# Patient Record
Sex: Male | Born: 1955 | Hispanic: Yes | Smoking: Former smoker
Health system: Southern US, Community
[De-identification: ages and names within clinical notes are randomized; demographics above are authoritative.]

## PROBLEM LIST (undated history)

## (undated) ENCOUNTER — Ambulatory Visit: Admission: EM | Source: Home / Self Care

## (undated) HISTORY — PX: HERNIA REPAIR: SHX51

---

## 2001-06-19 ENCOUNTER — Inpatient Hospital Stay (HOSPITAL_COMMUNITY): Admission: AD | Admit: 2001-06-19 | Discharge: 2001-06-23 | Payer: Self-pay | Admitting: Cardiology

## 2001-09-13 ENCOUNTER — Emergency Department (HOSPITAL_COMMUNITY): Admission: EM | Admit: 2001-09-13 | Discharge: 2001-09-13 | Payer: Self-pay | Admitting: Internal Medicine

## 2006-04-23 ENCOUNTER — Ambulatory Visit: Payer: Self-pay | Admitting: Cardiology

## 2010-10-06 NOTE — Cardiovascular Report (Signed)
[place]  Patient:    Michael Underwood, Michael Underwood Visit Number: [id number] MRN: [id number]          Service Type: MED Location: [phone] Attending Physician:  Mirian Mo Dictated by:   Daisey Must, M.D. [facility] Proc. Date: 06/23/01 Admit Date:  06/19/2001   CC:         Doreen Beam, M.D.             [facility]             Cardiac Catheterization Laboratory                        Cardiac Catheterization  PROCEDURES PERFORMED: Left heart catheterization with coronary angiography and left ventriculography.  INDICATIONS: The patient is a 55 year old male, who presented with recurrent episodes of substernal chest pain. A stress Cardiolite scan performed in [place] was interpreted as being abnormal, although I do not have complete details of this study. Because of his chest pain and abnormal Cardiolite scan, he was referred for cardiac catheterization.  DESCRIPTION OF PROCEDURE: A 6 French sheath was placed in the right femoral artery. Standard Judkins 6 French catheters were utilized.  Contrast was Omnipaque. There were no complications.  RESULTS:  HEMODYNAMICS: Left ventricular pressure 128/18.  Aortic pressure 128/74. There was no aortic valve gradient.  LEFT VENTRICULOGRAM: There is mild global hypokinesis of the left ventricle. Ejection fraction is calculated at 50%. There is no mitral regurgitation.  CORONARY ARTERIOGRAPHY: (Right dominant).  Left main has an ostial 30% stenosis.  Left anterior descending artery has an ostial 40% stenosis followed by a 30% stenosis in the proximal vessel. The LAD gives rise to a single small diagonal branch.  The left circumflex has a 20% stenosis in the proximal vessel. The circumflex gives rise to a large ramus intermedius, small OM-1 and normal sized OM-2.  The right coronary artery has a 20% stenosis in the proximal vessel. There was some catheter-induced spasm in the proximal right  coronary artery which was relieved with intracoronary nitroglycerin. The distal right coronary artery gives rise to a normal sized bifurcating posterior descending artery, a large first posterolateral branch, and a small second posterolateral branch.  IMPRESSIONS: 1. Mildly decreased left ventricular systolic function. 2. Mild but nonobstructive coronary artery disease.  SUMMARY: In summary, the patient appears to have a mild nonischemic cardiomyopathy. His chest pain is most likely noncardiac in etiology.  PLAN: The patient will be managed medically. Dictated by:   Daisey Must, M.D. [facility] Attending Physician:  Mirian Mo DD:  06/23/01 TD:  06/23/01 Job: [id number]

## 2010-10-06 NOTE — Discharge Summary (Signed)
[place]  Patient:    Michael Underwood, Michael Underwood Visit Number: [id number] MRN: [id number]          Service Type: MED Location: [phone] Attending Physician:  Mirian Mo Dictated by:   Rozell Searing, P.A.-C. Admit Date:  06/19/2001 Discharge Date: 06/23/2001   CC:         Doreen Beam, M.D., [place], [place]   Referring Physician Discharge [place]  PROCEDURE:  Coronary angiogram, June 23, 2001.  REASON FOR ADMISSION:  [name] is a 55 year old male, native of [place], who does not speak English, and who initially presented to [facility] for evaluation of chest pain radiating down the left arm.  His cardiac risk factors are notable for tobacco use and family history of coronary artery disease.  He was admitted and ruled out for myocardial infarction and was seen in consultation by Dr. Andee Lineman.  He was initially referred for an exercise stress Cardiolite test and, following a result suggestive of inferior/inferoseptal ischemia with ER of 44%, arrangements were made to transfer patient to [facility] for diagnostic coronary angiography.  LABORATORY DATA:  [facility]):  Negative MB fraction x 3 with peak total CPK of 219; marginally elevated troponin I levels (peak 0.06).  Lipid profile:  Total cholesterol 166, triglycerides 93, HDL 33, LDL 114, cholesterol/HDL ratio of 5.0, INR 1.0.  CBC normal.  Sodium 139, potassium 3.5, glucose 128, BUN 9, creatinine 0.8.  HOSPITAL COURSE:  Following transfer from [facility], patient was kept on aspirin and low-dose metoprolol.  Due to an overloaded cath schedule, patient had to wait proceeding with coronary angiography until Monday.  In the interim, he reported no complaints of chest discomfort.  Coronary angiogram, performed June 23, 2001, by Dr. Gerri Spore (see cath report for full details), revealed mild, nonobstructive coronary artery disease with mild LV dysfunction (EF 50%) with mild global  hypokinesis. Specifically, there was 30% ostial LMCA ______ and 40% ostial, 30% proximal LAD; 40% proximal CFX; 20% proximal RCA.  Medical therapy was recommended and patient was cleared for discharge later the same day.  DISCHARGE MEDICATIONS: 1. Coated aspirin 81 mg q.d. 2. Toprol XL 25 mg q.d.  INSTRUCTIONS:  The patient is to refrain from any heavy lifting, driving, or strenuous activity x 2 days; maintain low fat/cholesterol diet; call the office if there is any swelling/bleeding of the groin.  FOLLOW-UP:  The patient is scheduled to follow up with Dr. Simona Huh at [facility], July 11, 2001, at 11:45 a.m.  DISCHARGE DIAGNOSES: 1. Nonischemic cardiomyopathy.    a. Negative serial cardiac enzymes.    b. Mild, nonobstructive coronary artery disease/ejection fraction 50% -       cardiac catheterization June 23, 2001. 2. Dyslipidemia. 3. Tobacco. 4. Glucose intolerance. 5. Family history of coronary artery disease. Dictated by:   Rozell Searing, P.A.-C. Attending Physician:  Mirian Mo DD:  06/23/01 TD:  06/23/01 Job: [id number]

## 2018-03-30 ENCOUNTER — Encounter (HOSPITAL_COMMUNITY): Payer: Self-pay | Admitting: Emergency Medicine

## 2018-03-30 ENCOUNTER — Emergency Department (HOSPITAL_COMMUNITY)
Admission: EM | Admit: 2018-03-30 | Discharge: 2018-03-31 | Disposition: A | Discharge status: Home / Self Care | Payer: Self-pay | Attending: Emergency Medicine | Admitting: Emergency Medicine

## 2018-03-30 ENCOUNTER — Other Ambulatory Visit: Payer: Self-pay

## 2018-03-30 ENCOUNTER — Emergency Department (HOSPITAL_COMMUNITY): Payer: Self-pay

## 2018-03-30 DIAGNOSIS — R3911 Hesitancy of micturition: Secondary | ICD-10-CM | POA: Insufficient documentation

## 2018-03-30 DIAGNOSIS — R112 Nausea with vomiting, unspecified: Secondary | ICD-10-CM | POA: Insufficient documentation

## 2018-03-30 DIAGNOSIS — N201 Calculus of ureter: Secondary | ICD-10-CM | POA: Insufficient documentation

## 2018-03-30 LAB — COMPREHENSIVE METABOLIC PANEL
ALK PHOS: 77 U/L (ref 38–126)
ALT: 15 U/L (ref 0–44)
AST: 15 U/L (ref 15–41)
Albumin: 4 g/dL (ref 3.5–5.0)
Anion gap: 6 (ref 5–15)
BILIRUBIN TOTAL: 0.7 mg/dL (ref 0.3–1.2)
BUN: 22 mg/dL (ref 8–23)
CALCIUM: 10.3 mg/dL (ref 8.9–10.3)
CO2: 24 mmol/L (ref 22–32)
CREATININE: 1.04 mg/dL (ref 0.61–1.24)
Chloride: 107 mmol/L (ref 98–111)
Glucose, Bld: 153 mg/dL — ABNORMAL HIGH (ref 70–99)
Potassium: 3.8 mmol/L (ref 3.5–5.1)
Sodium: 137 mmol/L (ref 135–145)
TOTAL PROTEIN: 7.6 g/dL (ref 6.5–8.1)

## 2018-03-30 LAB — CBC
HCT: 42.5 % (ref 39.0–52.0)
Hemoglobin: 13.5 g/dL (ref 13.0–17.0)
MCH: 27.7 pg (ref 26.0–34.0)
MCHC: 31.8 g/dL (ref 30.0–36.0)
MCV: 87.1 fL (ref 80.0–100.0)
NRBC: 0 % (ref 0.0–0.2)
PLATELETS: 321 10*3/uL (ref 150–400)
RBC: 4.88 MIL/uL (ref 4.22–5.81)
RDW: 13.6 % (ref 11.5–15.5)
WBC: 12.3 10*3/uL — AB (ref 4.0–10.5)

## 2018-03-30 LAB — URINALYSIS, ROUTINE W REFLEX MICROSCOPIC
BILIRUBIN URINE: NEGATIVE
Bacteria, UA: NONE SEEN
Glucose, UA: 50 mg/dL — AB
Ketones, ur: 20 mg/dL — AB
LEUKOCYTES UA: NEGATIVE
NITRITE: NEGATIVE
Protein, ur: 100 mg/dL — AB
RBC / HPF: >50 RBC/hpf — ABNORMAL HIGH (ref 0–5)
SPECIFIC GRAVITY, URINE: 1.02 (ref 1.005–1.030)
pH: 6 (ref 5.0–8.0)

## 2018-03-30 LAB — LIPASE, BLOOD: LIPASE: 22 U/L (ref 11–51)

## 2018-03-30 MED ORDER — IOPAMIDOL (ISOVUE-300) INJECTION 61%
100.0000 mL | Freq: Once | INTRAVENOUS | Status: AC | PRN
  Administered 2018-03-31: 100 mL via INTRAVENOUS

## 2018-03-30 MED ORDER — ONDANSETRON 4 MG PO TBDP
ORAL_TABLET | ORAL | Status: AC
  Filled 2018-03-30: qty 1

## 2018-03-30 MED ORDER — ONDANSETRON 4 MG PO TBDP
4.0000 mg | ORAL_TABLET | Freq: Once | ORAL | Status: AC
  Administered 2018-03-30: 4 mg via ORAL

## 2018-03-30 NOTE — ED Notes (Signed)
Pt denies any vomiting since zofran was given.

## 2018-03-30 NOTE — ED Triage Notes (Addendum)
Pt C/O RLQ abdominal pain that started around 2 hours ago. Pt states it makes him "vomit every once in a while." Denies diarrhea. Pt states he has not been able to urinate since this AM.

## 2018-03-31 MED ORDER — ONDANSETRON 4 MG PO TBDP: 4.0000 mg | ORAL_TABLET | Freq: Three times a day (TID) | ORAL | 0 refills | Status: DC | PRN

## 2018-03-31 MED ORDER — HYDROCODONE-ACETAMINOPHEN 5-325 MG PO TABS: 1.0000 | ORAL_TABLET | ORAL | 0 refills | Status: DC | PRN

## 2018-03-31 MED ORDER — HYDROCODONE-ACETAMINOPHEN 5-325 MG PO TABS: 2.0000 | ORAL_TABLET | ORAL | 0 refills | Status: DC | PRN

## 2018-03-31 MED ORDER — TAMSULOSIN HCL 0.4 MG PO CAPS
0.4000 mg | ORAL_CAPSULE | Freq: Once | ORAL | Status: AC
  Administered 2018-03-31: 0.4 mg via ORAL
  Filled 2018-03-31: qty 1

## 2018-03-31 MED ORDER — TAMSULOSIN HCL 0.4 MG PO CAPS: 0.4000 mg | ORAL_CAPSULE | Freq: Every day | ORAL | 0 refills | Status: DC

## 2018-03-31 NOTE — Discharge Instructions (Addendum)
You have a large kidney stone in your lower right ureter (just above your bladder) which you may or may not be able to pass without surgical intervention.  You will need to see a urologist as soon as possible - call Dr Ronne Binning for an office visit this week.  In the interim, use the pain medicine prescribed if needed for pain relief - this will make you drowsy - do not drive within 4 hours of taking this medicine. Strain your urine so you will know if it passes and so you can take it to your appointment with Dr. Ronne Binning.  Take your next dose of flomax tomorrow night - this medicine can help to relax the ureter and help the stone pass easier.  Return here for any fevers, worse pain, uncontrolled vomiting or any new symptoms.   Also, we discussed tonight that your CT scan shows that your prostate gland is enlarged.  This can be a normal change that happens with aging, but you need to discuss this finding with Dr. Ronne Binning so is aware of this - he may decide you need further tests to make sure your prostate is healthy.    [facility]  [address] [place], [place] [address] [phone]  Services [facility] offers a variety of basic health services.  Services include but are not limited to: Blood pressure checks  Heart rate checks  Blood sugar checks  Urine analysis  Rapid strep tests  Pregnancy tests.  Health education and referrals  People needing more complex services will be directed to a physician online. Using these virtual visits, doctors can evaluate and prescribe medicine and treatments. There will be no medication on-site, though [facility] will help patients fill their prescriptions at little to no cost.   For More information please go to: [email or web address]

## 2018-03-31 NOTE — ED Provider Notes (Signed)
[facility] EMERGENCY DEPARTMENT Provider Note   CSN: [id number] Arrival date & time: 03/30/18  1324     History   Chief Complaint Chief Complaint  Patient presents with  . Abdominal Pain    HPI Michael Underwood is a 62 y.o. male with a distant history of kidney stones (last approx 10 years ago) presenting with RLQ pain described as intermittent aching pain with nausea and emesis x 2.  He also reports decreased urinary frequency with darker than normal urine.  His pain start shortly after eating pizza for dinner. He denies fevers or chills and his nausea is currently relieved after receiving zofran here.  He has had no treatment prior to arrival. Denies dysuria flank pain.  The history is provided by the patient and a relative.    History reviewed. No pertinent past medical history.  There are no active problems to display for this patient.   History reviewed. No pertinent surgical history.      Home Medications    Prior to Admission medications   Medication Sig Start Date End Date Taking? Authorizing Provider  HYDROcodone-acetaminophen (NORCO/VICODIN) 5-325 MG tablet Take 1 tablet by mouth every 4 (four) hours as needed. 03/31/18   Burgess Amor, PA-C  HYDROcodone-acetaminophen (NORCO/VICODIN) 5-325 MG tablet Take 2 tablets by mouth every 4 (four) hours as needed. 03/31/18   Burgess Amor, PA-C  ondansetron (ZOFRAN ODT) 4 MG disintegrating tablet Take 1 tablet (4 mg total) by mouth every 8 (eight) hours as needed for nausea or vomiting. 03/31/18   Burgess Amor, PA-C  tamsulosin (FLOMAX) 0.4 MG CAPS capsule Take 1 capsule (0.4 mg total) by mouth daily after supper. 03/31/18   Burgess Amor, PA-C    Family History No family history on file.  Social History Social History   Tobacco Use  . Smoking status: Never Smoker  . Smokeless tobacco: Never Used  Substance Use Topics  . Alcohol use: Never    Frequency: Never  . Drug use: Never     Allergies   Patient has no known  allergies.   Review of Systems Review of Systems  Constitutional: Negative for chills and fever.  HENT: Negative for congestion.   Eyes: Negative.   Respiratory: Negative for chest tightness and shortness of breath.   Cardiovascular: Negative for chest pain.  Gastrointestinal: Positive for abdominal pain, nausea and vomiting.  Genitourinary: Positive for decreased urine volume and difficulty urinating. Negative for dysuria.  Musculoskeletal: Negative for arthralgias, joint swelling and neck pain.  Skin: Negative.  Negative for rash and wound.  Neurological: Negative for dizziness, weakness, light-headedness, numbness and headaches.  Psychiatric/Behavioral: Negative.      Physical Exam Updated Vital Signs BP 117/78   Pulse 73   Temp 97.8 F (36.6 C) (Oral)   Resp 17   SpO2 97%   Physical Exam  Constitutional: He appears well-developed and well-nourished.  HENT:  Head: Normocephalic and atraumatic.  Eyes: Conjunctivae are normal.  Neck: Normal range of motion.  Cardiovascular: Normal rate, regular rhythm, normal heart sounds and intact distal pulses.  Pulmonary/Chest: Effort normal and breath sounds normal. He has no wheezes.  Abdominal: Soft. Bowel sounds are normal. There is tenderness in the right lower quadrant. There is no rigidity, no rebound, no guarding, no CVA tenderness and no tenderness at McBurney's point.  Musculoskeletal: Normal range of motion.  Neurological: He is alert.  Skin: Skin is warm and dry.  Psychiatric: He has a normal mood and affect.  Nursing note and vitals  reviewed.    ED Treatments / Results  Labs (all labs ordered are listed, but only abnormal results are displayed) Labs Reviewed  COMPREHENSIVE METABOLIC PANEL - Abnormal; Notable for the following components:      Result Value   Glucose, Bld 153 (*)    All other components within normal limits  CBC - Abnormal; Notable for the following components:   WBC 12.3 (*)    All other  components within normal limits  URINALYSIS, ROUTINE W REFLEX MICROSCOPIC - Abnormal; Notable for the following components:   APPearance CLOUDY (*)    Glucose, UA 50 (*)    Hgb urine dipstick LARGE (*)    Ketones, ur 20 (*)    Protein, ur 100 (*)    RBC / HPF >50 (*)    All other components within normal limits  LIPASE, BLOOD    EKG None  Radiology Ct Abdomen Pelvis W Contrast  Result Date: 03/31/2018 CLINICAL DATA:  Acute onset of right lower quadrant abdominal pain and vomiting. Inability to urinate. EXAM: CT ABDOMEN AND PELVIS WITH CONTRAST TECHNIQUE: Multidetector CT imaging of the abdomen and pelvis was performed using the standard protocol following bolus administration of intravenous contrast. CONTRAST:  ISOVUE-300 IOPAMIDOL (ISOVUE-300) INJECTION 61% COMPARISON:  CT of the abdomen and pelvis, and abdominal ultrasound, performed 01/26/2008 FINDINGS: Lower chest: Minimal bibasilar atelectasis is noted. The visualized portions of the mediastinum are unremarkable. Hepatobiliary: The liver is unremarkable in appearance. The gallbladder is unremarkable in appearance. The common bile duct remains normal in caliber. 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 The pancreatic head is unremarkable in appearance. Spleen: The spleen is unremarkable in appearance. Adrenals/Urinary Tract: The adrenal glands are unremarkable in appearance. Small bilateral renal cysts are seen. Right-sided perinephric stranding and fluid are noted. There is mild prominence of the right ureter, reflecting an obstructing 6 x 5 mm stone at the distal right ureter, 4 cm proximal to the right vesicoureteral junction. No significant hydronephrosis is seen. The left kidney is otherwise grossly unremarkable. Stomach/Bowel: The stomach is unremarkable in appearance. The small bowel is within normal limits. The appendix is normal in caliber, without evidence of appendicitis. The colon is  unremarkable in appearance. Vascular/Lymphatic: The abdominal aorta is unremarkable in appearance. The inferior vena cava is grossly unremarkable. No retroperitoneal lymphadenopathy is seen. No pelvic sidewall lymphadenopathy is identified. Reproductive: The bladder is mildly distended and grossly unremarkable. The prostate is normal in size. Increased central density at the prostate is nonspecific. Other: No additional soft tissue abnormalities are seen. Musculoskeletal: No acute osseous abnormalities are identified. The visualized musculature is unremarkable in appearance. IMPRESSION: 1. Mild prominence of the right ureter, reflecting an obstructing 6 x 5 mm stone at the distal right ureter, 4 cm proximal to the right vesicoureteral junction. No significant hydronephrosis seen at this time. 2. Small bilateral renal cysts seen. 3. Nonspecific increased central density at the prostate. Would correlate with PSA. Electronically Signed   By: Roanna Raider M.D.   On: 03/31/2018 00:58    Procedures Procedures (including critical care time)  Medications Ordered in ED Medications  ondansetron (ZOFRAN-ODT) disintegrating tablet 4 mg (4 mg Oral Given 03/30/18 2004)  iopamidol (ISOVUE-300) 61 % injection 100 mL (100 mLs Intravenous Contrast Given 03/31/18 0038)  tamsulosin (FLOMAX) capsule 0.4 mg (0.4 mg Oral Given 03/31/18 0204)     Initial Impression / Assessment and Plan / ED Course  I have reviewed the triage vital  signs and the nursing notes.  Pertinent labs & imaging results that were available during my care of the patient were reviewed by me and considered in my medical decision making (see chart for details).    Labs and Ct imaging reviewed and discussed with pt including ureteral stone and prostate findings. He was placed on hydrocodone and flomax, referral to urology for further management. Urine strainer given. Strict return precautions discussed. Pt sx free and comfortable at time of  dc.  Final Clinical Impressions(s) / ED Diagnoses   Final diagnoses:  Right ureteral stone    ED Discharge Orders         Ordered    HYDROcodone-acetaminophen (NORCO/VICODIN) 5-325 MG tablet  Every 4 hours PRN     03/31/18 0114    tamsulosin (FLOMAX) 0.4 MG CAPS capsule  Daily after supper     03/31/18 0114    ondansetron (ZOFRAN ODT) 4 MG disintegrating tablet  Every 8 hours PRN     03/31/18 0121    HYDROcodone-acetaminophen (NORCO/VICODIN) 5-325 MG tablet  Every 4 hours PRN     03/31/18 0121           Burgess Amor, PA-C 03/31/18 1415    Vanetta Mulders, MD 03/31/18 1620

## 2018-04-01 MED FILL — Hydrocodone-Acetaminophen Tab 5-325 MG: ORAL | Qty: 6 | Status: AC

## 2019-12-09 ENCOUNTER — Ambulatory Visit (INDEPENDENT_AMBULATORY_CARE_PROVIDER_SITE_OTHER): Payer: Self-pay | Admitting: Internal Medicine

## 2020-05-11 ENCOUNTER — Ambulatory Visit (INDEPENDENT_AMBULATORY_CARE_PROVIDER_SITE_OTHER): Payer: Medicare Other | Admitting: Internal Medicine

## 2021-06-14 ENCOUNTER — Ambulatory Visit
Admission: EM | Admit: 2021-06-14 | Discharge: 2021-06-14 | Disposition: A | Discharge status: Home / Self Care | Payer: Medicare HMO | Attending: Family Medicine | Admitting: Family Medicine

## 2021-06-14 ENCOUNTER — Other Ambulatory Visit: Payer: Self-pay

## 2021-06-14 DIAGNOSIS — K4090 Unilateral inguinal hernia, without obstruction or gangrene, not specified as recurrent: Secondary | ICD-10-CM | POA: Diagnosis not present

## 2021-06-14 MED ORDER — NAPROXEN 500 MG PO TABS: 500.0000 mg | ORAL_TABLET | Freq: Two times a day (BID) | ORAL | 0 refills | Status: DC

## 2021-06-14 NOTE — ED Triage Notes (Signed)
Pt reports he started having left sided groin pain 2 months ago. Pt reports he was diagnosed with a inguinal hernia 3 weeks ago. Inguinal belt supports gives relief. Pt denies pain at this moment. Pt wants to know hat to do next.

## 2021-06-14 NOTE — Discharge Instructions (Addendum)
Keep your appointment with your new primary doctor to discuss surgical referral.

## 2021-06-14 NOTE — ED Provider Notes (Signed)
°  [facility] CARE CENTER   [id number] 06/14/21 Arrival Time: 1056  ASSESSMENT & PLAN:  1. Non-recurrent unilateral inguinal hernia without obstruction or gangrene    No pain or signs of strangulation. Benign abdominal exam. No indications for urgent abdominal/pelvic imaging at this time. Discussed. Continue wearing hernia belt.  Meds ordered this encounter  Medications   naproxen (NAPROSYN) 500 MG tablet    Sig: Take 1 tablet (500 mg total) by mouth 2 (two) times daily with a meal.    Dispense:  20 tablet    Refill:  0     Discharge Instructions      Keep your appointment with your new primary doctor to discuss surgical referral.    Follow-up Information     [facility] EMERGENCY DEPARTMENT.   Specialty: Emergency Medicine Why: If symptoms worsen in any way. Contact information: [address] [id number] mc [place] [address] [phone]                Reviewed expectations re: course of current medical issues. Questions answered. Outlined signs and symptoms indicating need for more acute intervention. Patient verbalized understanding. After Visit Summary given.   SUBJECTIVE: History from: patient and family. Michael Underwood is a 66 y.o. male who reports being diagnosed in the past with a L inguinal hernia. Occas discomfort. Wears hernia belt which helps. No current pain or testicle swelling. "Just want to know what to do next". Normal bowel/bladder habits.  History reviewed. No pertinent surgical history.   OBJECTIVE:  Vitals:   06/14/21 1137  BP: 137/83  Pulse: 68  Resp: 18  Temp: 97.8 F (36.6 C)  TempSrc: Oral  SpO2: 98%    General appearance: alert, oriented, no acute distress Lungs: unlabored respirations Abdomen: soft; without distention; no specific tenderness to palpation GU: appears to have a direct L inguinal hernia that is easily reducible; no scrotal swelling Back: without reported CVA  tenderness; FROM at waist Extremities: without LE edema; symmetrical; without gross deformities Skin: warm and dry Neurologic: normal gait Psychological: alert and cooperative; normal mood and affect  No Known Allergies                                             History reviewed. No pertinent past medical history.  Social History   Socioeconomic History   Marital status: Divorced    Spouse name: Not on file   Number of children: Not on file   Years of education: Not on file   Highest education level: Not on file  Occupational History   Not on file  Tobacco Use   Smoking status: Never   Smokeless tobacco: Never  Substance and Sexual Activity   Alcohol use: Never   Drug use: Never   Sexual activity: Not on file  Other Topics Concern   Not on file  Social History Narrative   Not on file   Social Determinants of Health   Financial Resource Strain: Not on file  Food Insecurity: Not on file  Transportation Needs: Not on file  Physical Activity: Not on file  Stress: Not on file  Social Connections: Not on file  Intimate Partner Violence: Not on file    History reviewed. No pertinent family history.   Mardella Layman, MD 06/14/21 1212

## 2021-07-03 ENCOUNTER — Ambulatory Visit
Admission: EM | Admit: 2021-07-03 | Discharge: 2021-07-03 | Disposition: A | Discharge status: Home / Self Care | Payer: Medicare HMO | Attending: Urgent Care | Admitting: Urgent Care

## 2021-07-03 ENCOUNTER — Other Ambulatory Visit: Payer: Self-pay

## 2021-07-03 DIAGNOSIS — N23 Unspecified renal colic: Secondary | ICD-10-CM | POA: Insufficient documentation

## 2021-07-03 DIAGNOSIS — R300 Dysuria: Secondary | ICD-10-CM | POA: Insufficient documentation

## 2021-07-03 DIAGNOSIS — Z87442 Personal history of urinary calculi: Secondary | ICD-10-CM | POA: Insufficient documentation

## 2021-07-03 LAB — POCT URINALYSIS DIP (MANUAL ENTRY)
Bilirubin, UA: NEGATIVE
Glucose, UA: NEGATIVE mg/dL
Ketones, POC UA: NEGATIVE mg/dL
Leukocytes, UA: NEGATIVE
Nitrite, UA: NEGATIVE
Protein Ur, POC: NEGATIVE mg/dL
Spec Grav, UA: 1.02 (ref 1.010–1.025)
Urobilinogen, UA: 0.2 E.U./dL
pH, UA: 7 (ref 5.0–8.0)

## 2021-07-03 MED ORDER — TAMSULOSIN HCL 0.4 MG PO CAPS: 0.4000 mg | ORAL_CAPSULE | Freq: Every day | ORAL | 0 refills | Status: DC

## 2021-07-03 MED ORDER — ACETAMINOPHEN 325 MG PO TABS: 650.0000 mg | ORAL_TABLET | Freq: Four times a day (QID) | ORAL | 0 refills | Status: DC | PRN

## 2021-07-03 NOTE — ED Triage Notes (Signed)
Pt presents with  c/o dysuria and incontinence that began last night

## 2021-07-03 NOTE — ED Provider Notes (Signed)
[facility]   MRN: [id number] DOB: [birth year]  Subjective:   Michael Underwood is a 66 y.o. male presenting for 1 day history of recurrent painful urination, urinary straining.  He is also had some right-sided flank pain.  Denies fever, nausea, vomiting, hematuria, pelvic or perianal pain.  No history of BPH.  No history of prostatitis.  No concern for an STI.  He does have a history of a ureteral stone.  Does not know if he ever passed it.  Does not see a urologist.  No current facility-administered medications for this encounter.  Current Outpatient Medications:    naproxen (NAPROSYN) 500 MG tablet, Take 1 tablet (500 mg total) by mouth 2 (two) times daily with a meal., Disp: 20 tablet, Rfl: 0   ondansetron (ZOFRAN ODT) 4 MG disintegrating tablet, Take 1 tablet (4 mg total) by mouth every 8 (eight) hours as needed for nausea or vomiting., Disp: 10 tablet, Rfl: 0   tamsulosin (FLOMAX) 0.4 MG CAPS capsule, Take 1 capsule (0.4 mg total) by mouth daily after supper., Disp: 10 capsule, Rfl: 0   No Known Allergies  History reviewed. No pertinent past medical history.   History reviewed. No pertinent surgical history.  History reviewed. No pertinent family history.  Social History   Tobacco Use   Smoking status: Never   Smokeless tobacco: Never  Substance Use Topics   Alcohol use: Never   Drug use: Never    ROS   Objective:   Vitals: BP 130/79    Pulse 68    Temp 97.7 F (36.5 C)    Resp 18    SpO2 96%   Physical Exam Constitutional:      General: He is not in acute distress.    Appearance: Normal appearance. He is well-developed and normal weight. He is not ill-appearing, toxic-appearing or diaphoretic.  HENT:     Head: Normocephalic and atraumatic.     Right Ear: External ear normal.     Left Ear: External ear normal.     Nose: Nose normal.     Mouth/Throat:     Pharynx: Oropharynx is clear.  Eyes:     General: No scleral icterus.       Right eye: No  discharge.        Left eye: No discharge.     Extraocular Movements: Extraocular movements intact.  Cardiovascular:     Rate and Rhythm: Normal rate.  Pulmonary:     Effort: Pulmonary effort is normal.  Abdominal:     General: Bowel sounds are normal. There is no distension.     Palpations: Abdomen is soft. There is no mass.     Tenderness: There is abdominal tenderness (mild right flank tenderness). There is no right CVA tenderness, left CVA tenderness, guarding or rebound.  Musculoskeletal:     Cervical back: Normal range of motion.  Neurological:     Mental Status: He is alert and oriented to person, place, and time.  Psychiatric:        Mood and Affect: Mood normal.        Behavior: Behavior normal.        Thought Content: Thought content normal.        Judgment: Judgment normal.    Results for orders placed or performed during the hospital encounter of 07/03/21 (from the past 24 hour(s))  POCT urinalysis dipstick     Status: Abnormal   Collection Time: 07/03/21  8:59 AM  Result Value Ref Range  Color, UA yellow yellow   Clarity, UA hazy (A) clear   Glucose, UA negative negative mg/dL   Bilirubin, UA negative negative   Ketones, POC UA negative negative mg/dL   Spec Grav, UA 1.020 1.010 - 1.025   Blood, UA trace-intact (A) negative   pH, UA 7.0 5.0 - 8.0   Protein Ur, POC negative negative mg/dL   Urobilinogen, UA 0.2 0.2 or 1.0 E.U./dL   Nitrite, UA Negative Negative   Leukocytes, UA Negative Negative    Assessment and Plan :   PDMP not reviewed this encounter.  1. Renal colic on right side   2. Dysuria   3. History of renal stone    High suspicion for recurrent renal colic, renal stone.  Recommended aggressive hydration, tamsulosin.  Use Tylenol for pain.  Provided him with a urinary strainer.  Follow-up with urology.  Urine culture is pending. Counseled patient on potential for adverse effects with medications prescribed/recommended today, ER and  return-to-clinic precautions discussed, patient verbalized understanding.    Jaynee Eagles, PA-C 07/03/21 1100

## 2021-07-05 LAB — URINE CULTURE: Culture: <10000 — AB

## 2021-07-21 ENCOUNTER — Other Ambulatory Visit: Payer: Self-pay | Admitting: *Deleted

## 2021-07-21 DIAGNOSIS — K4090 Unilateral inguinal hernia, without obstruction or gangrene, not specified as recurrent: Secondary | ICD-10-CM

## 2021-07-28 ENCOUNTER — Other Ambulatory Visit: Payer: Self-pay

## 2021-07-28 ENCOUNTER — Ambulatory Visit (INDEPENDENT_AMBULATORY_CARE_PROVIDER_SITE_OTHER): Payer: Medicare HMO | Admitting: Surgery

## 2021-07-28 ENCOUNTER — Encounter: Payer: Self-pay | Admitting: Surgery

## 2021-07-28 VITALS — BP 119/77 | HR 61 | Temp 97.8°F | Resp 97 | Ht 59.0 in | Wt 137.0 lb

## 2021-07-28 DIAGNOSIS — K4090 Unilateral inguinal hernia, without obstruction or gangrene, not specified as recurrent: Secondary | ICD-10-CM | POA: Diagnosis not present

## 2021-07-28 NOTE — H&P (Signed)
[facility] History and Physical ?  ?Reason for Referral: Left inguinal hernia ?Referring Physician: Avon Gully, MD ?  ?Michael Underwood is a 66 y.o. male.  ?HPI: Patient presents for evaluation of his left inguinal hernia.  He states that it has been present since likely November 2022.  He was having increased pain associated with this hernia, so he returned from [place] to the states.  He was having pain that was limiting his walking.  He did note a bulge that started appearing in January.  He saw his primary care doctor, who got him a general surgery referral and recommended he wear hernia belt.  He is tolerating a diet without nausea and vomiting, and moving his bowels without difficulty.  His last bowel movement was yesterday.  He denies taking any blood thinning medications.  He denies taking any medications at this time, and denies any history of abdominal surgeries.  He denies use of tobacco products, alcohol, and illicit drugs. ?  ?No past medical history on file. ?  ?No past surgical history on file. ?  ?No family history on file. ?  ?Social History  ?  ?    ?Tobacco Use  ? Smoking status: Never  ? Smokeless tobacco: Never  ?Substance Use Topics  ? Alcohol use: Never  ? Drug use: Never  ?  ?  ?Medications: I have reviewed the patient's current medications. ?Allergies as of 07/28/2021   ?No Known Allergies ?   ?  ?   ?Medication List  ?   ?  ?   ? Accurate as of July 28, 2021  9:35 AM. If you have any questions, ask your nurse or doctor.  ?  ?   ?  ?   ?  ?acetaminophen 325 MG tablet ?Commonly known as: Tylenol ?Take 2 tablets (650 mg total) by mouth every 6 (six) hours as needed. ?   ?naproxen 500 MG tablet ?Commonly known as: NAPROSYN ?Take 1 tablet (500 mg total) by mouth 2 (two) times daily with a meal. ?   ?ondansetron 4 MG disintegrating tablet ?Commonly known as: Zofran ODT ?Take 1 tablet (4 mg total) by mouth every 8 (eight) hours as needed for nausea or vomiting. ?   ?tamsulosin  0.4 MG Caps capsule ?Commonly known as: FLOMAX ?Take 1 capsule (0.4 mg total) by mouth daily after supper. ?   ?  ?   ?  ?  ?ROS:  ?Constitutional: negative for chills, fatigue, and fevers ?Eyes: negative for visual disturbance and pain ?Ears, nose, mouth, throat, and face: negative for ear drainage, sore throat, and sinus problems ?Respiratory: negative for cough, wheezing, and shortness of breath ?Cardiovascular: negative for chest pain and palpitations ?Gastrointestinal: positive for abdominal pain, negative for nausea, reflux symptoms, and vomiting ?Genitourinary:negative for dysuria and frequency ?Integument/breast: negative for dryness and rash ?Hematologic/lymphatic: negative for bleeding and lymphadenopathy ?Musculoskeletal:negative for back pain, neck pain, and joint pain ?Neurological: negative for dizziness, tremors, and numbness ?Endocrine: negative for temperature intolerance ?  ?There were no vitals taken for this visit. ?Physical Exam ?Vitals reviewed.  ?Constitutional:   ?   Appearance: Normal appearance.  ?HENT:  ?   Head: Normocephalic and atraumatic.  ?Eyes:  ?   Extraocular Movements: Extraocular movements intact.  ?   Pupils: Pupils are equal, round, and reactive to light.  ?Cardiovascular:  ?   Rate and Rhythm: Normal rate and regular rhythm.  ?Pulmonary:  ?   Effort: Pulmonary effort is normal.  ?  Breath sounds: Normal breath sounds.  ?Abdominal:  ?   General: There is no distension.  ?   Palpations: Abdomen is soft.  ?   Tenderness: There is no abdominal tenderness.  ?Genitourinary: ?   Comments: Soft, reducible left inguinal hernia, mild TTP ?Musculoskeletal:     ?   General: Normal range of motion.  ?   Cervical back: Normal range of motion.  ?Skin: ?   General: Skin is warm and dry.  ?Neurological:  ?   General: No focal deficit present.  ?   Mental Status: He is alert and oriented to person, place, and time.  ?Psychiatric:     ?   Mood and Affect: Mood normal.     ?   Behavior: Behavior  normal.  ?  ?  ?Results: ?Lab Results Last 48 Hours  ?No results found for this or any previous visit (from the past 48 hour(s)).  ? ?  ?Imaging Results (Last 48 hours)  ?No results found.  ? ?  ?  ?Assessment & Plan:  ?Michael Underwood is a 66 y.o. male who presents for evaluation of left inguinal hernia. ?  ?-Patient with a soft, reducible, and mildly tender left inguinal hernia ?-The risk and benefits of open left inguinal hernia with mesh were discussed including but not limited to bleeding, infection, injury to surrounding structures, nerve injury, hernia recurrence, and need for additional procedure.  After careful consideration, Michael Underwood has decided to proceed with the surgery.  ?-Patient tentatively scheduled for 3/22 ?  ?All questions were answered to the satisfaction of the patient and family. ?  ?Theophilus Kinds, DO ?[facility] ?[address] ?[place], [place] [address] ?[phone] (office) ?  ?  ?  ?  ?   ?  ? ?

## 2021-07-28 NOTE — Progress Notes (Signed)
[facility] History and Physical ? ?Reason for Referral: Left inguinal hernia ?Referring Physician: Avon Gully, MD ? ?Michael Underwood is a 66 y.o. male.  ?HPI: Patient presents for evaluation of his left inguinal hernia.  He states that it has been present since likely November 2022.  He was having increased pain associated with this hernia, so he returned from [place] to the states.  He was having pain that was limiting his walking.  He did note a bulge that started appearing in January.  He saw his primary care doctor, who got him a general surgery referral and recommended he wear hernia belt.  He is tolerating a diet without nausea and vomiting, and moving his bowels without difficulty.  His last bowel movement was yesterday.  He denies taking any blood thinning medications.  He denies taking any medications at this time, and denies any history of abdominal surgeries.  He denies use of tobacco products, alcohol, and illicit drugs. ? ?No past medical history on file. ? ?No past surgical history on file. ? ?No family history on file. ? ?Social History  ? ?Tobacco Use  ? Smoking status: Never  ? Smokeless tobacco: Never  ?Substance Use Topics  ? Alcohol use: Never  ? Drug use: Never  ? ? ?Medications: I have reviewed the patient's current medications. ?Allergies as of 07/28/2021   ?No Known Allergies ?  ? ?  ?Medication List  ?  ? ?  ? Accurate as of July 28, 2021  9:35 AM. If you have any questions, ask your nurse or doctor.  ?  ?  ? ?  ? ?acetaminophen 325 MG tablet ?Commonly known as: Tylenol ?Take 2 tablets (650 mg total) by mouth every 6 (six) hours as needed. ?  ?naproxen 500 MG tablet ?Commonly known as: NAPROSYN ?Take 1 tablet (500 mg total) by mouth 2 (two) times daily with a meal. ?  ?ondansetron 4 MG disintegrating tablet ?Commonly known as: Zofran ODT ?Take 1 tablet (4 mg total) by mouth every 8 (eight) hours as needed for nausea or vomiting. ?  ?tamsulosin 0.4 MG Caps  capsule ?Commonly known as: FLOMAX ?Take 1 capsule (0.4 mg total) by mouth daily after supper. ?  ? ?  ? ? ?ROS:  ?Constitutional: negative for chills, fatigue, and fevers ?Eyes: negative for visual disturbance and pain ?Ears, nose, mouth, throat, and face: negative for ear drainage, sore throat, and sinus problems ?Respiratory: negative for cough, wheezing, and shortness of breath ?Cardiovascular: negative for chest pain and palpitations ?Gastrointestinal: positive for abdominal pain, negative for nausea, reflux symptoms, and vomiting ?Genitourinary:negative for dysuria and frequency ?Integument/breast: negative for dryness and rash ?Hematologic/lymphatic: negative for bleeding and lymphadenopathy ?Musculoskeletal:negative for back pain, neck pain, and joint pain ?Neurological: negative for dizziness, tremors, and numbness ?Endocrine: negative for temperature intolerance ? ?There were no vitals taken for this visit. ?Physical Exam ?Vitals reviewed.  ?Constitutional:   ?   Appearance: Normal appearance.  ?HENT:  ?   Head: Normocephalic and atraumatic.  ?Eyes:  ?   Extraocular Movements: Extraocular movements intact.  ?   Pupils: Pupils are equal, round, and reactive to light.  ?Cardiovascular:  ?   Rate and Rhythm: Normal rate and regular rhythm.  ?Pulmonary:  ?   Effort: Pulmonary effort is normal.  ?   Breath sounds: Normal breath sounds.  ?Abdominal:  ?   General: There is no distension.  ?   Palpations: Abdomen is soft.  ?   Tenderness: There is  no abdominal tenderness.  ?Genitourinary: ?   Comments: Soft, reducible left inguinal hernia, mild TTP ?Musculoskeletal:     ?   General: Normal range of motion.  ?   Cervical back: Normal range of motion.  ?Skin: ?   General: Skin is warm and dry.  ?Neurological:  ?   General: No focal deficit present.  ?   Mental Status: He is alert and oriented to person, place, and time.  ?Psychiatric:     ?   Mood and Affect: Mood normal.     ?   Behavior: Behavior normal.   ? ? ?Results: ?No results found for this or any previous visit (from the past 48 hour(s)). ? ?No results found. ? ? ?Assessment & Plan:  ?Michael Underwood is a 66 y.o. male who presents for evaluation of left inguinal hernia. ? ?-Patient with a soft, reducible, and mildly tender left inguinal hernia ?-The risk and benefits of open left inguinal hernia with mesh were discussed including but not limited to bleeding, infection, injury to surrounding structures, nerve injury, hernia recurrence, and need for additional procedure.  After careful consideration, Connar Keating has decided to proceed with the surgery.  ?-Patient tentatively scheduled for 3/22 ? ?All questions were answered to the satisfaction of the patient and family. ? ?Theophilus Kinds, DO ?[facility] ?[address] ?[place], [place] [address] ?[phone] (office) ? ? ? ? ? ?

## 2021-08-04 ENCOUNTER — Other Ambulatory Visit (HOSPITAL_COMMUNITY): Payer: Self-pay | Admitting: Gerontology

## 2021-08-04 ENCOUNTER — Other Ambulatory Visit: Payer: Self-pay

## 2021-08-04 ENCOUNTER — Ambulatory Visit (HOSPITAL_COMMUNITY)
Admission: RE | Admit: 2021-08-04 | Discharge: 2021-08-04 | Disposition: A | Discharge status: Home / Self Care | Payer: Medicare HMO | Source: Ambulatory Visit | Attending: Gerontology | Admitting: Gerontology

## 2021-08-04 DIAGNOSIS — M542 Cervicalgia: Secondary | ICD-10-CM | POA: Insufficient documentation

## 2021-08-07 ENCOUNTER — Other Ambulatory Visit: Payer: Self-pay | Admitting: *Deleted

## 2021-08-07 ENCOUNTER — Encounter (HOSPITAL_COMMUNITY): Payer: Medicare HMO

## 2021-08-07 DIAGNOSIS — K4090 Unilateral inguinal hernia, without obstruction or gangrene, not specified as recurrent: Secondary | ICD-10-CM

## 2021-08-09 ENCOUNTER — Ambulatory Visit: Admit: 2021-08-09 | Payer: Medicare HMO | Admitting: Surgery

## 2021-08-09 SURGERY — REPAIR, HERNIA, INGUINAL, ADULT
Anesthesia: General | Laterality: Left

## 2021-09-06 DIAGNOSIS — K4091 Unilateral inguinal hernia, without obstruction or gangrene, recurrent: Secondary | ICD-10-CM | POA: Diagnosis not present

## 2021-09-11 DIAGNOSIS — K4090 Unilateral inguinal hernia, without obstruction or gangrene, not specified as recurrent: Secondary | ICD-10-CM | POA: Diagnosis not present

## 2021-09-20 DIAGNOSIS — H6692 Otitis media, unspecified, left ear: Secondary | ICD-10-CM | POA: Diagnosis not present

## 2021-09-20 DIAGNOSIS — Z9889 Other specified postprocedural states: Secondary | ICD-10-CM | POA: Diagnosis not present

## 2021-09-22 ENCOUNTER — Ambulatory Visit: Payer: Medicare Other | Admitting: Family Medicine

## 2021-09-25 ENCOUNTER — Ambulatory Visit: Payer: Medicare Other | Admitting: Family Medicine

## 2021-09-28 ENCOUNTER — Ambulatory Visit: Payer: Medicare HMO | Admitting: Internal Medicine

## 2021-11-03 DIAGNOSIS — R1030 Lower abdominal pain, unspecified: Secondary | ICD-10-CM | POA: Diagnosis not present

## 2022-03-07 ENCOUNTER — Encounter: Payer: Self-pay | Admitting: Emergency Medicine

## 2022-03-07 ENCOUNTER — Other Ambulatory Visit: Payer: Self-pay

## 2022-03-07 ENCOUNTER — Ambulatory Visit
Admission: EM | Admit: 2022-03-07 | Discharge: 2022-03-07 | Disposition: A | Discharge status: Home / Self Care | Payer: Medicare Other | Attending: Family Medicine | Admitting: Family Medicine

## 2022-03-07 DIAGNOSIS — H1031 Unspecified acute conjunctivitis, right eye: Secondary | ICD-10-CM | POA: Diagnosis not present

## 2022-03-07 MED ORDER — ERYTHROMYCIN 5 MG/GM OP OINT: TOPICAL_OINTMENT | OPHTHALMIC | 0 refills | Status: DC

## 2022-03-07 NOTE — ED Triage Notes (Addendum)
Pt reports right eye light sensitivity, redness, "feels like sand is in it." Left eye also noted to be red. Pt  denies any known injury. Reports otc eye drops have not helped symptoms.  Pt reports does not wear glasses or contacts. Pt is wearing sunglasses due to light sensitivity.

## 2022-03-07 NOTE — ED Provider Notes (Signed)
[facility]    CSN: [id number] Arrival date & time: 03/07/22  1028      History   Chief Complaint Chief Complaint  Patient presents with   Eye Problem    HPI Michael Underwood is a 66 y.o. male.   Presenting today with 1 day history of right eye redness, photophobia, irritation.  Denies any injury to the eye, new products used at home, vomiting, headache, nausea, loss of vision.  States he does not wear glasses or eye contacts.  So far not trying anything over-the-counter other than over-the-counter lubricating drops.    History reviewed. No pertinent past medical history.  There are no problems to display for this patient.   History reviewed. No pertinent surgical history.     Home Medications    Prior to Admission medications   Medication Sig Start Date End Date Taking? Authorizing Provider  erythromycin ophthalmic ointment Place a 1/2 inch ribbon of ointment into the right lower eyelid BID prn. 03/07/22  Yes Particia Nearing, PA-C  acetaminophen (TYLENOL) 325 MG tablet Take 2 tablets (650 mg total) by mouth every 6 (six) hours as needed. 07/03/21   Wallis Bamberg, PA-C  naproxen (NAPROSYN) 500 MG tablet Take 1 tablet (500 mg total) by mouth 2 (two) times daily with a meal. 06/14/21   Mardella Layman, MD  ondansetron (ZOFRAN ODT) 4 MG disintegrating tablet Take 1 tablet (4 mg total) by mouth every 8 (eight) hours as needed for nausea or vomiting. 03/31/18   Burgess Amor, PA-C  tamsulosin (FLOMAX) 0.4 MG CAPS capsule Take 1 capsule (0.4 mg total) by mouth daily after supper. 07/03/21   Wallis Bamberg, PA-C    Family History History reviewed. No pertinent family history.  Social History Social History   Tobacco Use   Smoking status: Never   Smokeless tobacco: Never  Substance Use Topics   Alcohol use: Never   Drug use: Never     Allergies   Patient has no known allergies.   Review of Systems Review of Systems Per HPI  Physical Exam Triage Vital  Signs ED Triage Vitals [03/07/22 1321]  Enc Vitals Group     BP (!) 144/82     Pulse Rate 61     Resp 20     Temp 98.2 F (36.8 C)     Temp Source Oral     SpO2 97 %     Weight      Height      Head Circumference      Peak Flow      Pain Score 2     Pain Loc      Pain Edu?      Excl. in GC?    No data found.  Updated Vital Signs BP (!) 144/82 (BP Location: Right Arm)   Pulse 61   Temp 98.2 F (36.8 C) (Oral)   Resp 20   SpO2 97%   Visual Acuity Right Eye Distance: 20/50 Left Eye Distance: 20/40 Bilateral Distance: 20/30  Right Eye Near:   Left Eye Near:    Bilateral Near:     Physical Exam Vitals and nursing note reviewed.  Constitutional:      Appearance: Normal appearance.  HENT:     Head: Atraumatic.     Mouth/Throat:     Mouth: Mucous membranes are moist.  Eyes:     Extraocular Movements: Extraocular movements intact.     Pupils: Pupils are equal, round, and reactive to light.  Comments: Right conjunctiva diffusely erythematous, injected.  Drainage present no foreign body appreciable  Cardiovascular:     Rate and Rhythm: Normal rate and regular rhythm.  Pulmonary:     Effort: Pulmonary effort is normal.     Breath sounds: Normal breath sounds.  Musculoskeletal:        General: Normal range of motion.     Cervical back: Normal range of motion and neck supple.  Skin:    General: Skin is warm and dry.  Neurological:     General: No focal deficit present.     Mental Status: He is oriented to person, place, and time.  Psychiatric:        Mood and Affect: Mood normal.        Thought Content: Thought content normal.        Judgment: Judgment normal.      UC Treatments / Results  Labs (all labs ordered are listed, but only abnormal results are displayed) Labs Reviewed - No data to display  EKG   Radiology No results found.  Procedures Procedures (including critical care time)  Medications Ordered in UC Medications - No data to  display  Initial Impression / Assessment and Plan / UC Course  I have reviewed the triage vital signs and the nursing notes.  Pertinent labs & imaging results that were available during my care of the patient were reviewed by me and considered in my medical decision making (see chart for details).     Vital signs and visual acuity reassuring, treat with erythromycin ointment, warm compresses, good handwashing.  Ophthalmology follow-up if worsening or not resolving.  Final Clinical Impressions(s) / UC Diagnoses   Final diagnoses:  Acute conjunctivitis of right eye, unspecified acute conjunctivitis type     Discharge Instructions      Apply warm compresses to the eye off-and-on.  Follow-up with an eye specialist if symptoms are worsening.     ED Prescriptions     Medication Sig Dispense Auth. Provider   erythromycin ophthalmic ointment Place a 1/2 inch ribbon of ointment into the right lower eyelid BID prn. 3.5 g Volney American, PA-C      PDMP not reviewed this encounter.   Volney American, [place] 03/09/22 1517

## 2022-03-07 NOTE — Discharge Instructions (Addendum)
Apply warm compresses to the eye off-and-on.  Follow-up with an eye specialist if symptoms are worsening.

## 2022-04-17 ENCOUNTER — Ambulatory Visit (INDEPENDENT_AMBULATORY_CARE_PROVIDER_SITE_OTHER): Payer: Medicare Other | Admitting: Family

## 2022-04-17 ENCOUNTER — Encounter: Payer: Self-pay | Admitting: Family

## 2022-04-17 VITALS — BP 114/80 | HR 72 | Temp 97.5°F | Resp 16 | Ht 59.0 in | Wt 135.6 lb

## 2022-04-17 DIAGNOSIS — H9193 Unspecified hearing loss, bilateral: Secondary | ICD-10-CM | POA: Diagnosis not present

## 2022-04-17 DIAGNOSIS — Z789 Other specified health status: Secondary | ICD-10-CM

## 2022-04-17 DIAGNOSIS — Z87891 Personal history of nicotine dependence: Secondary | ICD-10-CM

## 2022-04-17 DIAGNOSIS — Z7689 Persons encountering health services in other specified circumstances: Secondary | ICD-10-CM

## 2022-04-17 DIAGNOSIS — Z1211 Encounter for screening for malignant neoplasm of colon: Secondary | ICD-10-CM

## 2022-04-17 DIAGNOSIS — Z1159 Encounter for screening for other viral diseases: Secondary | ICD-10-CM

## 2022-04-17 DIAGNOSIS — Z8719 Personal history of other diseases of the digestive system: Secondary | ICD-10-CM | POA: Diagnosis not present

## 2022-04-17 DIAGNOSIS — R350 Frequency of micturition: Secondary | ICD-10-CM | POA: Diagnosis not present

## 2022-04-17 DIAGNOSIS — Z13220 Encounter for screening for lipoid disorders: Secondary | ICD-10-CM | POA: Diagnosis not present

## 2022-04-17 DIAGNOSIS — Z9889 Other specified postprocedural states: Secondary | ICD-10-CM | POA: Diagnosis not present

## 2022-04-17 LAB — POCT URINALYSIS DIPSTICK
Bilirubin, UA: NEGATIVE
Blood, UA: NEGATIVE
Glucose, UA: NEGATIVE
Ketones, UA: POSITIVE
Nitrite, UA: NEGATIVE
Protein, UA: POSITIVE — AB
Spec Grav, UA: >=1.03 — AB (ref 1.010–1.025)
Urobilinogen, UA: NEGATIVE E.U./dL — AB
pH, UA: 5 (ref 5.0–8.0)

## 2022-04-17 NOTE — Progress Notes (Signed)
Provider: Marlowe Sax FNP-C   Jeraline Marcinek, Nelda Bucks, NP  Patient Care Team: Velton Roselle, Nelda Bucks, NP as PCP - General (Family Medicine)  Extended Emergency Contact Information Primary Emergency Contact: [place],INEZ Address: [address]  [place] Phone: [phone] Mobile Phone: [phone] Relation: Daughter Secondary Emergency Contact: [place] Mobile Phone: [phone] Relation: Daughter  Code Status:  Full Code  Goals of care: Advanced Directive information    04/17/2022   10:00 AM  Advanced Directives  Does Patient Have a Medical Advance Directive? No  Would patient like information on creating a medical advance directive? No - Patient declined     Chief Complaint  Patient presents with   Establish Care    New Patient.     HPI:  Pt is a 66 y.o. male seen today establish care here at [facility].He is here with daughter who provides additional HPI information. Has flomax on his medication list but not sure if he has been taking since he has been ordering medication from [place].Daughter will take pictures of medication then send on Mychart.He states voids upto 4 times during the night,with some hesitancy and dribbling.denies any urine retention or abdominal pain.    Hernia - had left hernia repair done April,2023 has had sharp every once a while.he denies any bulging or constipation.Daughter states recently started working on his yard and might have strained.Has taken tylenol and ibuprofen with relief.   Blurry vision sometimes.He was seen by ophthalmology has early cataract.  States had an injection given by Dr.Patel not sure if it was a flu shot or Pneumonia vaccine.No records for review.Daughter will obtain records then update.will hold off on Influenza vaccine and PNA till we obtain records. Has some decreased hearing.   States used to smoke cigarettes but quit 30 yrs ago. Daughter also states drinks beer  occasionally.does not like to drink water.  History reviewed. No pertinent past medical history. Past Surgical History:  Procedure Laterality Date   HERNIA REPAIR      No Known Allergies  Allergies as of 04/17/2022   No Known Allergies      Medication List        Accurate as of April 17, 2022 12:38 PM. If you have any questions, ask your nurse or doctor.          STOP taking these medications    acetaminophen 325 MG tablet Commonly known as: Tylenol Stopped by: Sandrea Hughs, NP   naproxen 500 MG tablet Commonly known as: NAPROSYN Stopped by: Nelda Bucks Dayzee Trower, NP   ondansetron 4 MG disintegrating tablet Commonly known as: Zofran ODT Stopped by: Sandrea Hughs, NP   tamsulosin 0.4 MG Caps capsule Commonly known as: FLOMAX Stopped by: Sandrea Hughs, NP       TAKE these medications    erythromycin ophthalmic ointment Place a 1/2 inch ribbon of ointment into the right lower eyelid BID prn.   ibuprofen 800 MG tablet Commonly known as: ADVIL Take 800 mg by mouth every 8 (eight) hours as needed for mild pain or moderate pain.        Review of Systems  Constitutional:  Negative for appetite change, chills, fatigue, fever and unexpected weight change.  HENT:  Positive for hearing loss. Negative for congestion, dental problem, ear discharge, ear pain, facial swelling, nosebleeds, postnasal drip, rhinorrhea, sinus pressure, sinus pain, sneezing, sore throat, tinnitus and trouble swallowing.  Decreased hearing   Eyes:  Negative for pain, discharge, redness, itching and visual disturbance.  Respiratory:  Negative for cough, chest tightness, shortness of breath and wheezing.   Cardiovascular:  Negative for chest pain, palpitations and leg swelling.  Gastrointestinal:  Negative for abdominal distention, abdominal pain, blood in stool, constipation, diarrhea, nausea and vomiting.  Endocrine: Negative for cold intolerance, heat intolerance, polydipsia,  polyphagia and polyuria.  Genitourinary:  Positive for frequency. Negative for difficulty urinating, dysuria, flank pain and urgency.       Voids x 4 times at night   Musculoskeletal:  Negative for arthralgias, back pain, gait problem, joint swelling, myalgias, neck pain and neck stiffness.  Skin:  Negative for color change, pallor, rash and wound.  Neurological:  Negative for dizziness, syncope, speech difficulty, weakness, light-headedness, numbness and headaches.  Hematological:  Does not bruise/bleed easily.  Psychiatric/Behavioral:  Negative for agitation, behavioral problems, confusion, hallucinations, sleep disturbance and suicidal ideas.        Anxietious sometimes when he argues with girlfriend has upcoming appointment with Psychiatry service later this week     Immunization History  Administered Date(s) Administered   Moderna Sars-Covid-2 Vaccination 10/23/2019, 11/20/2019   Td 09/11/2013   Tdap 09/11/2013   Pertinent  Health Maintenance Due  Topic Date Due   COLONOSCOPY (Pts 45-166yrs Insurance coverage will need to be confirmed)  Never done   INFLUENZA VACCINE  Never done      06/14/2021   11:44 AM 07/03/2021    9:48 AM 07/28/2021    9:37 AM 03/07/2022    1:22 PM 04/17/2022    9:59 AM  Fall Risk  Falls in the past year?   0  0  Was there an injury with Fall?     0  Fall Risk Category Calculator     0  Fall Risk Category     Low  Patient Fall Risk Level Low fall risk Low fall risk Low fall risk Low fall risk Low fall risk  Patient at Risk for Falls Due to     No Fall Risks  Fall risk Follow up   Falls evaluation completed  Falls evaluation completed   Functional Status Survey:    Vitals:   04/17/22 0950  BP: 114/80  Pulse: 72  Resp: 16  Temp: (!) 97.5 F (36.4 C)  SpO2: 98%  Weight: 135 lb 9.6 oz (61.5 kg)  Height: 4\' 11"  (1.499 m)   Body mass index is 27.39 kg/m. Physical Exam Vitals reviewed.  Constitutional:      General: He is not in acute  distress.    Appearance: Normal appearance. He is overweight. He is not ill-appearing or diaphoretic.  HENT:     Head: Normocephalic.     Right Ear: External ear normal.     Left Ear: External ear normal.     Ears:     Comments: Partial cerumen impaction on both ears noted    Nose: Nose normal. No congestion or rhinorrhea.     Mouth/Throat:     Mouth: Mucous membranes are moist.     Pharynx: Oropharynx is clear. No oropharyngeal exudate or posterior oropharyngeal erythema.  Eyes:     General: No scleral icterus.       Right eye: No discharge.        Left eye: No discharge.     Extraocular Movements: Extraocular movements intact.     Conjunctiva/sclera: Conjunctivae normal.     Pupils: Pupils are equal, round, and reactive  to light.     Comments: Has reading glasses   Neck:     Vascular: No carotid bruit.  Cardiovascular:     Rate and Rhythm: Normal rate and regular rhythm.     Pulses: Normal pulses.     Heart sounds: Normal heart sounds. No murmur heard.    No friction rub. No gallop.  Pulmonary:     Effort: Pulmonary effort is normal. No respiratory distress.     Breath sounds: Normal breath sounds. No wheezing, rhonchi or rales.  Chest:     Chest wall: No tenderness.  Abdominal:     General: Bowel sounds are normal. There is no distension.     Palpations: Abdomen is soft. There is no mass.     Tenderness: There is no abdominal tenderness. There is no right CVA tenderness, left CVA tenderness, guarding or rebound.  Musculoskeletal:        General: No swelling or tenderness. Normal range of motion.     Cervical back: Normal range of motion. No rigidity or tenderness.     Right lower leg: No edema.     Left lower leg: No edema.  Lymphadenopathy:     Cervical: No cervical adenopathy.  Skin:    General: Skin is warm and dry.     Coloration: Skin is not pale.     Findings: No bruising, erythema, lesion or rash.  Neurological:     Mental Status: He is alert and oriented to  person, place, and time.     Cranial Nerves: No cranial nerve deficit.     Sensory: No sensory deficit.     Motor: No weakness.     Coordination: Coordination normal.     Gait: Gait normal.  Psychiatric:        Mood and Affect: Mood normal.        Speech: Speech normal.        Behavior: Behavior normal.        Thought Content: Thought content normal.        Judgment: Judgment normal.     Labs reviewed: No results for input(s): "NA", "K", "CL", "CO2", "GLUCOSE", "BUN", "CREATININE", "CALCIUM", "MG", "PHOS" in the last 8760 hours. No results for input(s): "AST", "ALT", "ALKPHOS", "BILITOT", "PROT", "ALBUMIN" in the last 8760 hours. No results for input(s): "WBC", "NEUTROABS", "HGB", "HCT", "MCV", "PLT" in the last 8760 hours. No results found for: "TSH" No results found for: "HGBA1C" No results found for: "CHOL", "HDL", "LDLCALC", "LDLDIRECT", "TRIG", "CHOLHDL"  Significant Diagnostic Results in last 30 days:  No results found.  Assessment/Plan 1. Encounter to establish care No medical records for review has signed release of medical records from previous PCP then will update records and immunization.patient decline Influenza and PNA vaccine until records are obtain by daughter.Had vaccine given in April but does not recall what kind of vaccine.Also recommend to schedule for fasting labs has already had his breakfast today.    2. Screening for hyperlipidemia Dietary modification and exercise advised.  - Lipid panel; Future  3. Encounter for hepatitis C screening test for low risk patient Reports low risk  - Hepatitis C antibody; Future  4. Colon cancer screening Asymptomatic.No previous colonoscopy done.  - Ambulatory referral to Gastroenterology  5. Hx of inguinal hernia surgery S/p repair 08/2021 no records for review. Has sharp pain sometimes.tylenol has been effective.Recently started doing yard work.Cautioned to avoid heavy lifting. - Advised to notify surgeon if  symptoms worsen.  - COMPLETE METABOLIC PANEL WITH  GFR; Future - CBC with Differential/Platelet; Future  6. Urine frequency Worst at night.suspect possible enlarged prostate patient had Flomax on previous medication list though unclear if still using.will check PSA level then restart Flomax if indicated.  - CBC with Differential/Platelet; Future - PSA, Total and Free; Future - Urine Culture - POC Urinalysis Dipstick  7. Decreased hearing of both ears Will refer to ENT for evaluation of hearing.partial cerumen impaction noted.  - Ambulatory referral to ENT  Family/ staff Communication: Reviewed plan of care with patient and daughter verbalized understanding   Labs/tests ordered: None   Next Appointment : Return in about 6 months (around 10/16/2022) for medical mangement of chronic issues., fasting labs in one week.   Caesar Bookman, NP

## 2022-04-18 ENCOUNTER — Ambulatory Visit: Payer: Medicaid Other | Admitting: Family Medicine

## 2022-04-18 LAB — URINE CULTURE
MICRO NUMBER:: 14241818
SPECIMEN QUALITY:: ADEQUATE

## 2022-04-24 ENCOUNTER — Ambulatory Visit: Payer: Medicare Other | Admitting: Family

## 2022-04-24 ENCOUNTER — Other Ambulatory Visit: Payer: Medicare Other

## 2022-04-24 DIAGNOSIS — Z8719 Personal history of other diseases of the digestive system: Secondary | ICD-10-CM

## 2022-04-24 DIAGNOSIS — R350 Frequency of micturition: Secondary | ICD-10-CM | POA: Diagnosis not present

## 2022-04-24 DIAGNOSIS — E7800 Pure hypercholesterolemia, unspecified: Secondary | ICD-10-CM | POA: Diagnosis not present

## 2022-04-24 DIAGNOSIS — Z1159 Encounter for screening for other viral diseases: Secondary | ICD-10-CM

## 2022-04-24 DIAGNOSIS — Z13220 Encounter for screening for lipoid disorders: Secondary | ICD-10-CM

## 2022-04-24 DIAGNOSIS — Z9889 Other specified postprocedural states: Secondary | ICD-10-CM | POA: Diagnosis not present

## 2022-04-26 ENCOUNTER — Encounter: Payer: Medicare Other | Admitting: Family

## 2022-04-26 LAB — CBC WITH DIFFERENTIAL/PLATELET
Absolute Monocytes: 522 cells/uL (ref 200–950)
Basophils Absolute: 17 cells/uL (ref 0–200)
Basophils Relative: 0.3 %
Eosinophils Absolute: 209 cells/uL (ref 15–500)
Eosinophils Relative: 3.6 %
HCT: 43.2 % (ref 38.5–50.0)
Hemoglobin: 14.7 g/dL (ref 13.2–17.1)
Lymphs Abs: 1392 cells/uL (ref 850–3900)
MCH: 28.9 pg (ref 27.0–33.0)
MCHC: 34 g/dL (ref 32.0–36.0)
MCV: 84.9 fL (ref 80.0–100.0)
MPV: 10.4 fL (ref 7.5–12.5)
Monocytes Relative: 9 %
Neutro Abs: 3660 cells/uL (ref 1500–7800)
Neutrophils Relative %: 63.1 %
Platelets: 354 10*3/uL (ref 140–400)
RBC: 5.09 10*6/uL (ref 4.20–5.80)
RDW: 13.7 % (ref 11.0–15.0)
Total Lymphocyte: 24 %
WBC: 5.8 10*3/uL (ref 3.8–10.8)

## 2022-04-26 LAB — LIPID PANEL
Cholesterol: 131 mg/dL (ref <200)
HDL: 30 mg/dL — ABNORMAL LOW (ref >=40)
LDL Cholesterol (Calc): 82 mg/dL (calc)
Non-HDL Cholesterol (Calc): 101 mg/dL (calc) (ref <130)
Total CHOL/HDL Ratio: 4.4 (calc) (ref <5.0)
Triglycerides: 94 mg/dL (ref <150)

## 2022-04-26 LAB — HEPATITIS C ANTIBODY: Hepatitis C Ab: NONREACTIVE

## 2022-04-26 LAB — COMPLETE METABOLIC PANEL WITH GFR
AG Ratio: 1.2 (calc) (ref 1.0–2.5)
ALT: 8 U/L — ABNORMAL LOW (ref 9–46)
AST: 9 U/L — ABNORMAL LOW (ref 10–35)
Albumin: 3.8 g/dL (ref 3.6–5.1)
Alkaline phosphatase (APISO): 104 U/L (ref 35–144)
BUN: 23 mg/dL (ref 7–25)
CO2: 25 mmol/L (ref 20–32)
Calcium: 10.9 mg/dL — ABNORMAL HIGH (ref 8.6–10.3)
Chloride: 107 mmol/L (ref 98–110)
Creat: 0.87 mg/dL (ref 0.70–1.35)
Globulin: 3.2 g/dL (calc) (ref 1.9–3.7)
Glucose, Bld: 92 mg/dL (ref 65–99)
Potassium: 4.6 mmol/L (ref 3.5–5.3)
Sodium: 137 mmol/L (ref 135–146)
Total Bilirubin: 0.6 mg/dL (ref 0.2–1.2)
Total Protein: 7 g/dL (ref 6.1–8.1)
eGFR: 94 mL/min/{1.73_m2} (ref >=60)

## 2022-04-26 LAB — PSA, TOTAL AND FREE
PSA, % Free: 25 % (calc) — ABNORMAL LOW (ref >25)
PSA, Free: 0.7 ng/mL
PSA, Total: 2.8 ng/mL (ref <=4.0)

## 2022-04-26 NOTE — Progress Notes (Signed)
  This encounter was created in error - please disregard. No show 

## 2022-06-07 ENCOUNTER — Encounter: Payer: Self-pay | Admitting: Gastroenterology

## 2022-07-04 ENCOUNTER — Encounter: Payer: Self-pay | Admitting: Orthopedic Surgery

## 2022-07-04 ENCOUNTER — Telehealth: Payer: Self-pay

## 2022-07-04 ENCOUNTER — Ambulatory Visit (INDEPENDENT_AMBULATORY_CARE_PROVIDER_SITE_OTHER): Payer: 59 | Admitting: Orthopedic Surgery

## 2022-07-04 ENCOUNTER — Encounter: Payer: 59 | Admitting: Family

## 2022-07-04 VITALS — Ht 59.0 in | Wt 135.0 lb

## 2022-07-04 DIAGNOSIS — Z0000 Encounter for general adult medical examination without abnormal findings: Secondary | ICD-10-CM

## 2022-07-04 NOTE — Telephone Encounter (Signed)
Called patient for AWV was unable to reach patient. After 3rd attempt LVM for patient to call office to reschedule his appointment at his earliest convenience . Office number was left.

## 2022-07-04 NOTE — Patient Instructions (Signed)
  Michael Underwood , Thank you for taking time to come for your Medicare Wellness Visit. I appreciate your ongoing commitment to your health goals. Please review the following plan we discussed and let me know if I can assist you in the future.   These are the goals we discussed:  Goals      Maintain Mobility and Function     Evidence-based guidance:  Acknowledge and validate impact of pain, loss of strength and potential disfigurement (hand osteoarthritis) on mental health and daily life, such as social isolation, anxiety, depression, impaired sexual relationship and   injury from falls.  Anticipate referral to physical or occupational therapy for assessment, therapeutic exercise and recommendation for adaptive equipment or assistive devices; encourage participation.  Assess impact on ability to perform activities of daily living, as well as engage in sports and leisure events or requirements of work or school.  Provide anticipatory guidance and reassurance about the benefit of exercise to maintain function; acknowledge and normalize fear that exercise may worsen symptoms.  Encourage regular exercise, at least 10 minutes at a time for 45 minutes per week; consider yoga, water exercise and proprioceptive exercises; encourage use of wearable activity tracker to increase motivation and adherence.  Encourage maintenance or resumption of daily activities, including employment, as pain allows and with minimal exposure to trauma.  Assist patient to advocate for adaptations to the work environment.  Consider level of pain and function, gender, age, lifestyle, patient preference, quality of life, readiness and ?ocapacity to benefit? when recommending patients for orthopaedic surgery consultation.  Explore strategies, such as changes to medication regimen or activity that enables patient to anticipate and manage flare-ups that increase deconditioning and disability.  Explore patient preferences; encourage  exposure to a broader range of activities that have been avoided for fear of experiencing pain.  Identify barriers to participation in therapy or exercise, such as pain with activity, anticipated or imagined pain.  Monitor postoperative joint replacement or any preexisting joint replacement for ongoing pain and loss of function; provide social support and encouragement throughout recovery.   Notes:         This is a list of the screening recommended for you and due dates:  Health Maintenance  Topic Date Due   Colon Cancer Screening  Never done   Zoster (Shingles) Vaccine (1 of 2) Never done   Pneumonia Vaccine (1 of 1 - PCV) Never done   Flu Shot  Never done   COVID-19 Vaccine (3 - 2023-24 season) 01/19/2022   Medicare Annual Wellness Visit  07/05/2023   DTaP/Tdap/Td vaccine (3 - Td or Tdap) 09/12/2023   Hepatitis C Screening: USPSTF Recommendation to screen - Ages 30-79 yo.  Completed   HPV Vaccine  Aged Out   Recommend getting pneumonia vaccine (prevnar 20) at local pharmacy. Consider getting shingles vaccine (called Shingrix) as well at local pharmacy.

## 2022-07-04 NOTE — Progress Notes (Deleted)
This service is provided via telemedicine  No vital signs collected/recorded due to the encounter was a telemedicine visit.   Location of patient (ex: home, work):  Home   Patient consents to a telephone visit:  Yes, see telephone encounter dated   Location of the provider (ex: office, home):  [facility]  Name of any referring provider:  N/A  Names of all persons participating in the telemedicine service and their role in the encounter: Cassadie Pankonin B/CMA, Ngetich, Dinah C, NP , and patient  Time spent on call:  11 minutes

## 2022-07-04 NOTE — Progress Notes (Signed)
Subjective:   Michael Underwood is a 67 y.o. male who presents for Medicare Annual/Subsequent preventive examination.  Review of Systems     Cardiac Risk Factors include: advanced age (>82mn, >>7women);male gender     Objective:    Today's Vitals   07/04/22 1251  Weight: 135 lb (61.2 kg)  Height: 4' 11"$  (1.499 m)   Body mass index is 27.27 kg/m.     07/04/2022    1:44 PM 04/17/2022   10:00 AM 03/30/2018    8:00 PM  Advanced Directives  Does Patient Have a Medical Advance Directive? No No No  Would patient like information on creating a medical advance directive? No - Patient declined No - Patient declined     Current Medications (verified) Outpatient Encounter Medications as of 07/04/2022  Medication Sig   ibuprofen (ADVIL) 800 MG tablet Take 800 mg by mouth every 8 (eight) hours as needed for mild pain or moderate pain.   [DISCONTINUED] erythromycin ophthalmic ointment Place a 1/2 inch ribbon of ointment into the right lower eyelid BID prn.   No facility-administered encounter medications on file as of 07/04/2022.    Allergies (verified) Patient has no known allergies.   History: History reviewed. No pertinent past medical history. Past Surgical History:  Procedure Laterality Date   HERNIA REPAIR     History reviewed. No pertinent family history. Social History   Socioeconomic History   Marital status: Divorced    Spouse name: Not on file   Number of children: Not on file   Years of education: Not on file   Highest education level: Not on file  Occupational History   Not on file  Tobacco Use   Smoking status: Former    Types: Cigarettes   Smokeless tobacco: Never  Substance and Sexual Activity   Alcohol use: Yes    Alcohol/week: 2.0 standard drinks of alcohol    Types: 2 Cans of beer per week    Comment: socially   Drug use: Never   Sexual activity: Not on file  Other Topics Concern   Not on file  Social History Narrative   Not on file   Social  Determinants of Health   Financial Resource Strain: Medium Risk (07/04/2022)   Overall Financial Resource Strain (CARDIA)    Difficulty of Paying Living Expenses: Somewhat hard  Food Insecurity: No Food Insecurity (07/04/2022)   Hunger Vital Sign    Worried About Running Out of Food in the Last Year: Never true    Ran Out of Food in the Last Year: Never true  Transportation Needs: No Transportation Needs (07/04/2022)   PRAPARE - THydrologist(Medical): No    Lack of Transportation (Non-Medical): No  Physical Activity: Sufficiently Active (07/04/2022)   Exercise Vital Sign    Days of Exercise per Week: 7 days    Minutes of Exercise per Session: 30 min  Stress: No Stress Concern Present (07/04/2022)   [place]   Feeling of Stress : Not at all  Social Connections: Moderately Integrated (07/04/2022)   Social Connection and Isolation Panel [NHANES]    Frequency of Communication with Friends and Family: Three times a week    Frequency of Social Gatherings with Friends and Family: Once a week    Attends Religious Services: More than 4 times per year    Active Member of [employer] Organizations: Yes    Attends CArchivistMeetings:  More than 4 times per year    Marital Status: Divorced    Tobacco Counseling Counseling given: Not Answered   Clinical Intake:  Pre-visit preparation completed: No  Pain : No/denies pain     BMI - recorded: 26 Nutritional Risks: None Diabetes: No  How often do you need to have someone help you when you read instructions, pamphlets, or other written materials from your doctor or pharmacy?: 1 - Never What is the last grade level you completed in school?: High school  Diabetic?No  Interpreter Needed?: No      Activities of Daily Living    07/04/2022    2:09 PM  In your present state of health, do you have any difficulty performing the following  activities:  Hearing? 0  Vision? 0  Walking or climbing stairs? 0  Doing errands, shopping? 0  Using the Toilet? N  In the past six months, have you accidently leaked urine? N  Managing your Medications? N  Managing your Finances? N  Housekeeping or managing your Housekeeping? N    Patient Care Team: Ngetich, Michael Bucks, NP as PCP - General (Family Medicine)  Indicate any recent Medical Services you may have received from other than [facility] providers in the past year (date may be approximate).     Assessment:   This is a routine wellness examination for Michael Underwood.  Hearing/Vision screen No results found.  Dietary issues and exercise activities discussed: Current Exercise Habits: The patient does not participate in regular exercise at present, Exercise limited by: None identified   Goals Addressed             This Visit's Progress    Maintain Mobility and Function   On track    Evidence-based guidance:  Acknowledge and validate impact of pain, loss of strength and potential disfigurement (hand osteoarthritis) on mental health and daily life, such as social isolation, anxiety, depression, impaired sexual relationship and   injury from falls.  Anticipate referral to physical or occupational therapy for assessment, therapeutic exercise and recommendation for adaptive equipment or assistive devices; encourage participation.  Assess impact on ability to perform activities of daily living, as well as engage in sports and leisure events or requirements of work or school.  Provide anticipatory guidance and reassurance about the benefit of exercise to maintain function; acknowledge and normalize fear that exercise may worsen symptoms.  Encourage regular exercise, at least 10 minutes at a time for 45 minutes per week; consider yoga, water exercise and proprioceptive exercises; encourage use of wearable activity tracker to increase motivation and adherence.  Encourage maintenance or resumption  of daily activities, including employment, as pain allows and with minimal exposure to trauma.  Assist patient to advocate for adaptations to the work environment.  Consider level of pain and function, gender, age, lifestyle, patient preference, quality of life, readiness and ?ocapacity to benefit? when recommending patients for orthopaedic surgery consultation.  Explore strategies, such as changes to medication regimen or activity that enables patient to anticipate and manage flare-ups that increase deconditioning and disability.  Explore patient preferences; encourage exposure to a broader range of activities that have been avoided for fear of experiencing pain.  Identify barriers to participation in therapy or exercise, such as pain with activity, anticipated or imagined pain.  Monitor postoperative joint replacement or any preexisting joint replacement for ongoing pain and loss of function; provide social support and encouragement throughout recovery.   Notes:        Depression Screen  07/04/2022    1:46 PM 04/17/2022   10:36 AM  PHQ 2/9 Scores  PHQ - 2 Score 0 1  PHQ- 9 Score 0   Exception Documentation Other- indicate reason in comment box   Not completed AWV     Fall Risk    07/04/2022    2:09 PM 07/04/2022    1:45 PM 04/17/2022    9:59 AM 07/28/2021    9:37 AM  Fall Risk   Falls in the past year? 0 0 0 0  Number falls in past yr: 0 0 0   Injury with Fall? 0 0 0   Risk for fall due to : No Fall Risks No Fall Risks No Fall Risks   Follow up Falls evaluation completed;Education provided Falls evaluation completed Falls evaluation completed Falls evaluation completed    [place]:  Any stairs in or around the home? Yes  If so, are there any without handrails? No  Home free of loose throw rugs in walkways, pet beds, electrical cords, etc? Yes  Adequate lighting in your home to reduce risk of falls? Yes   ASSISTIVE DEVICES UTILIZED TO  PREVENT FALLS:  Life alert? No  Use of a cane, walker or w/c? No  Grab bars in the bathroom? No  Shower chair or bench in shower? No  Elevated toilet seat or a handicapped toilet? No   TIMED UP AND GO:  Was the test performed? No .  Length of time to ambulate 10 feet: N/A sec.   Gait steady and fast without use of assistive device  Cognitive Function:    07/04/2022    2:09 PM  MMSE - Mini Mental State Exam  Not completed: Refused        07/04/2022    1:47 PM  6CIT Screen  What Year? 0 points  What month? 0 points  What time? 0 points  Count back from 20 0 points  Months in reverse 0 points  Repeat phrase 4 points  Total Score 4 points    Immunizations Immunization History  Administered Date(s) Administered   Moderna Sars-Covid-2 Vaccination 10/23/2019, 11/20/2019   Td 09/11/2013   Tdap 09/11/2013    TDAP status: Up to date  Flu Vaccine status: Declined, Education has been provided regarding the importance of this vaccine but patient still declined. Advised may receive this vaccine at local pharmacy or Health Dept. Aware to provide a copy of the vaccination record if obtained from local pharmacy or Health Dept. Verbalized acceptance and understanding.  Pneumococcal vaccine status: Due, Education has been provided regarding the importance of this vaccine. Advised may receive this vaccine at local pharmacy or Health Dept. Aware to provide a copy of the vaccination record if obtained from local pharmacy or Health Dept. Verbalized acceptance and understanding.  Covid-19 vaccine status: Completed vaccines  Qualifies for Shingles Vaccine? No   Zostavax completed Yes   Shingrix Completed?: No.    Education has been provided regarding the importance of this vaccine. Patient has been advised to call insurance company to determine out of pocket expense if they have not yet received this vaccine. Advised may also receive vaccine at local pharmacy or Health Dept. Verbalized  acceptance and understanding.  Screening Tests Health Maintenance  Topic Date Due   COLONOSCOPY (Pts 45-26yr Insurance coverage will need to be confirmed)  Never done   Zoster Vaccines- Shingrix (1 of 2) Never done   Pneumonia Vaccine 67 Years old (158of 1 -  PCV) Never done   INFLUENZA VACCINE  Never done   COVID-19 Vaccine (3 - 2023-24 season) 01/19/2022   Medicare Annual Wellness (AWV)  07/05/2023   DTaP/Tdap/Td (3 - Td or Tdap) 09/12/2023   Hepatitis C Screening  Completed   HPV VACCINES  Aged Out    Health Maintenance  Health Maintenance Due  Topic Date Due   COLONOSCOPY (Pts 45-74yr Insurance coverage will need to be confirmed)  Never done   Zoster Vaccines- Shingrix (1 of 2) Never done   Pneumonia Vaccine 67 Years old (1 of 1 - PCV) Never done   INFLUENZA VACCINE  Never done   COVID-19 Vaccine (3 - 2023-24 season) 01/19/2022    Colorectal cancer screening: Referral to GI placed 06/2022. Pt aware the office will call re: appt.  Lung Cancer Screening: (Low Dose CT Chest recommended if Age 69-80years, 30 pack-year currently smoking OR have quit w/in 15years.) does not qualify.   Lung Cancer Screening Referral: No  Additional Screening:  Hepatitis C Screening: does qualify; unknown if done  Vision Screening: Recommended annual ophthalmology exams for early detection of glaucoma and other disorders of the eye. Is the patient up to date with their annual eye exam?  No  Who is the provider or what is the name of the office in which the patient attends annual eye exams? Cannot recall provider If pt is not established with a provider, would they like to be referred to a provider to establish care? No .   Dental Screening: Recommended annual dental exams for proper oral hygiene  Community Resource Referral / Chronic Care Management: CRR required this visit?  No   CCM required this visit?  No      Plan:     I have personally reviewed and noted the following in the  patient's chart:   Medical and social history Use of alcohol, tobacco or illicit drugs  Current medications and supplements including opioid prescriptions. Patient is not currently taking opioid prescriptions. Functional ability and status Nutritional status Physical activity Advanced directives List of other physicians Hospitalizations, surgeries, and ER visits in previous 12 months Vitals Screenings to include cognitive, depression, and falls Referrals and appointments  In addition, I have reviewed and discussed with patient certain preventive protocols, quality metrics, and best practice recommendations. A written personalized care plan for preventive services as well as general preventive health recommendations were provided to patient.     AYvonna Alanis NP   07/04/2022   Nurse Notes: Scheduled to have colonoscopy 03/04> pre op visit 07/06/2022. Refused flu vaccine. Interested in getting pneumonia vaccine. Does not want additional covid vaccines at this time.   Virtual Visit   I connected with SVida Riggervia virtual visit and verified that I am speaking with the correct person using two identifiers.  [place]: Michael SpindelProvider: AYvonna Alanis NP    I discussed the limitations, risks, security and privacy concerns of performing an evaluation and management service by telephone and the availability of in person appointments. I also discussed with the patient that there may be a patient responsible charge related to this service. The patient expressed understanding and agreed to proceed.   I discussed the assessment and treatment plan with the patient. The patient was provided an opportunity to ask questions and all were answered. The patient agreed with the plan and demonstrated an understanding of the instructions.   The patient was advised to call back or seek an  in-person evaluation if the symptoms worsen or if the condition fails to improve  as anticipated.  I provided 15 minutes of face-to-face time during this encounter.  [place], AGNP Avs printed and mailed

## 2022-07-04 NOTE — Progress Notes (Signed)
This service is provided via telemedicine  No vital signs collected/recorded due to the encounter was a telemedicine visit.   Location of patient (ex: home, work):  home  Patient consents to a telephone visit:  yes  Location of the provider (ex: office, home):  [facility]  Names of all persons participating in the telemedicine service and their role in the encounter:  patient, Cleophas Dunker, [place] NP, [place]   Time spent on call:  66mnutes

## 2022-07-06 ENCOUNTER — Ambulatory Visit (INDEPENDENT_AMBULATORY_CARE_PROVIDER_SITE_OTHER): Payer: 59 | Admitting: Family

## 2022-07-06 ENCOUNTER — Ambulatory Visit (AMBULATORY_SURGERY_CENTER): Payer: 59

## 2022-07-06 ENCOUNTER — Encounter: Payer: Self-pay | Admitting: Family

## 2022-07-06 VITALS — BP 120/78 | HR 73 | Temp 98.0°F | Resp 18 | Ht 59.0 in | Wt 136.0 lb

## 2022-07-06 VITALS — Ht 59.0 in | Wt 130.0 lb

## 2022-07-06 DIAGNOSIS — U071 COVID-19: Secondary | ICD-10-CM

## 2022-07-06 DIAGNOSIS — Z1211 Encounter for screening for malignant neoplasm of colon: Secondary | ICD-10-CM

## 2022-07-06 DIAGNOSIS — J069 Acute upper respiratory infection, unspecified: Secondary | ICD-10-CM

## 2022-07-06 DIAGNOSIS — N50812 Left testicular pain: Secondary | ICD-10-CM | POA: Diagnosis not present

## 2022-07-06 LAB — POC COVID19 BINAXNOW: SARS Coronavirus 2 Ag: POSITIVE — AB

## 2022-07-06 MED ORDER — NA SULFATE-K SULFATE-MG SULF 17.5-3.13-1.6 GM/177ML PO SOLN: 1.0000 | Freq: Once | ORAL | 0 refills | Status: AC

## 2022-07-06 MED ORDER — GUAIFENESIN ER 600 MG PO TB12: 600.0000 mg | ORAL_TABLET | Freq: Two times a day (BID) | ORAL | 0 refills | Status: DC

## 2022-07-06 MED ORDER — NIRMATRELVIR/RITONAVIR (PAXLOVID)TABLET: 3.0000 | ORAL_TABLET | Freq: Two times a day (BID) | ORAL | 0 refills | Status: AC

## 2022-07-06 MED ORDER — VITAMIN C 250 MG PO TABS: 250.0000 mg | ORAL_TABLET | Freq: Two times a day (BID) | ORAL | 0 refills | Status: AC

## 2022-07-06 MED ORDER — ZINC GLUCONATE 50 MG PO TABS: 50.0000 mg | ORAL_TABLET | Freq: Every day | ORAL | 0 refills | Status: AC

## 2022-07-06 MED ORDER — VITAMIN D3 50 MCG (2000 UT) PO CAPS: 2000.0000 [IU] | ORAL_CAPSULE | Freq: Every day | ORAL | 0 refills | Status: AC

## 2022-07-06 NOTE — Progress Notes (Signed)
Provider: Marlowe Sax FNP-C  Chantz Montefusco, Nelda Bucks, NP  Patient Care Team: Meline Russaw, Nelda Bucks, NP as PCP - General (Family Medicine)  Extended Emergency Contact Information Primary Emergency Contact: Ou,INEZ Address: [address]          [address]  [place] Home Phone: [phone] Mobile Phone: [phone] Relation: Daughter Secondary Emergency Contact: [place] Mobile Phone: [phone] Relation: Daughter  Code Status:  Full Code  Goals of care: Advanced Directive information    07/04/2022    1:44 PM  Advanced Directives  Does Patient Have a Medical Advance Directive? No  Would patient like information on creating a medical advance directive? No - Patient declined     Chief Complaint  Patient presents with   Acute Visit    Patient complains of ongoing cough. Patient has stuffy nose and has had cough for about 3 weeks. Patient has a little phlegm. Deep cough. Patient has been taking tylenol and over the counter medication. Patient states that h is having pain in the left side testicle. Patient states that he feels like he has a fever.    HPI:  Pt is a 67 y.o. male seen today for an acute visit for evaluation of  cough,stuffy nose for about 3 weeks. Has had a little phlegm with deep coughing. Has been taking tylenol and over the counter medication. Has felt like he has a fever but not checked Temperature.  Daughter states his father recently return from [place]. Also complains of pain in the left side testicle.denies any redness or swelling   No past medical history on file. Past Surgical History:  Procedure Laterality Date   HERNIA REPAIR      No Known Allergies  Outpatient Encounter Medications as of 07/06/2022  Medication Sig   ibuprofen (ADVIL) 800 MG tablet Take 800 mg by mouth every 8 (eight) hours as needed for mild pain or moderate pain.   No facility-administered encounter medications on file as of 07/06/2022.    Review of Systems   Constitutional:  Negative for appetite change, chills, fatigue, fever and unexpected weight change.  HENT:  Positive for congestion, rhinorrhea and sore throat. Negative for dental problem, ear discharge, ear pain, facial swelling, hearing loss, nosebleeds, postnasal drip, sinus pressure, sinus pain, sneezing, tinnitus and trouble swallowing.   Eyes:  Negative for pain, discharge, redness, itching and visual disturbance.  Respiratory:  Positive for cough. Negative for chest tightness, shortness of breath and wheezing.   Cardiovascular:  Negative for chest pain, palpitations and leg swelling.  Gastrointestinal:  Negative for abdominal distention, abdominal pain, blood in stool, constipation, diarrhea, nausea and vomiting.  Genitourinary:  Negative for difficulty urinating, dysuria, flank pain, frequency and urgency.  Musculoskeletal:  Negative for arthralgias, back pain, gait problem, joint swelling, myalgias, neck pain and neck stiffness.  Skin:  Negative for color change, pallor, rash and wound.  Neurological:  Negative for dizziness, syncope, speech difficulty, weakness, light-headedness, numbness and headaches.  Hematological:  Does not bruise/bleed easily.  Psychiatric/Behavioral:  Negative for agitation, behavioral problems, confusion, hallucinations, self-injury, sleep disturbance and suicidal ideas. The patient is not nervous/anxious.     Immunization History  Administered Date(s) Administered   Moderna Sars-Covid-2 Vaccination 10/23/2019, 11/20/2019   Td 09/11/2013   Tdap 09/11/2013   Pertinent  Health Maintenance Due  Topic Date Due   COLONOSCOPY (Pts 45-48yr Insurance coverage will need to be confirmed)  Never done   INFLUENZA VACCINE  08/19/2022 (Originally 12/19/2021)      03/07/2022  1:22 PM 04/17/2022    9:59 AM 07/04/2022    1:45 PM 07/04/2022    2:09 PM 07/06/2022   11:16 AM  Fall Risk  Falls in the past year?  0 0 0 0  Was there an injury with Fall?  0 0 0 0  Fall  Risk Category Calculator  0 0 0 0  Fall Risk Category (Retired)  Low     (RETIRED) Patient Fall Risk Level Low fall risk Low fall risk     Patient at Risk for Falls Due to  No Fall Risks No Fall Risks No Fall Risks No Fall Risks  Fall risk Follow up  Falls evaluation completed Falls evaluation completed Falls evaluation completed;Education provided Falls evaluation completed   Functional Status Survey:    Vitals:   07/06/22 1117  BP: 120/78  Pulse: 73  Resp: 18  Temp: 98 F (36.7 C)  SpO2: 98%  Weight: 136 lb (61.7 kg)  Height: 4' 11"$  (1.499 m)   Body mass index is 27.47 kg/m. Physical Exam Vitals reviewed. Exam conducted with a chaperone present Carroll Kinds ,CMA).  Constitutional:      General: He is not in acute distress.    Appearance: Normal appearance. He is overweight. He is not ill-appearing or diaphoretic.  HENT:     Head: Normocephalic.     Right Ear: Tympanic membrane, ear canal and external ear normal. There is no impacted cerumen.     Left Ear: Tympanic membrane, ear canal and external ear normal. There is no impacted cerumen.     Nose: Congestion and rhinorrhea present.     Mouth/Throat:     Mouth: Mucous membranes are moist.     Pharynx: Oropharynx is clear. No oropharyngeal exudate or posterior oropharyngeal erythema.  Eyes:     General: No scleral icterus.       Right eye: No discharge.        Left eye: No discharge.     Extraocular Movements: Extraocular movements intact.     Conjunctiva/sclera: Conjunctivae normal.     Pupils: Pupils are equal, round, and reactive to light.  Neck:     Vascular: No carotid bruit.  Cardiovascular:     Rate and Rhythm: Normal rate and regular rhythm.     Pulses: Normal pulses.     Heart sounds: Normal heart sounds. No murmur heard.    No friction rub. No gallop.  Pulmonary:     Effort: Pulmonary effort is normal. No respiratory distress.     Breath sounds: Normal breath sounds. No wheezing, rhonchi or rales.  Chest:      Chest wall: No tenderness.  Abdominal:     General: Bowel sounds are normal. There is no distension.     Palpations: Abdomen is soft. There is no mass.     Tenderness: There is no abdominal tenderness. There is no right CVA tenderness, left CVA tenderness, guarding or rebound.     Hernia: There is no hernia in the left inguinal area or right inguinal area.  Genitourinary:    Testes:        Right: Mass, tenderness, swelling or testicular hydrocele not present. Cremasteric reflex is present.         Left: Tenderness present. Mass, swelling or testicular hydrocele not present. Cremasteric reflex is present.      Epididymis:     Right: Normal.     Left: Normal.  Musculoskeletal:        General: No swelling or  tenderness. Normal range of motion.     Cervical back: Normal range of motion. No rigidity or tenderness.     Right lower leg: No edema.     Left lower leg: No edema.  Lymphadenopathy:     Cervical: No cervical adenopathy.     Lower Body: No right inguinal adenopathy. No left inguinal adenopathy.  Skin:    General: Skin is warm and dry.     Coloration: Skin is not pale.     Findings: No bruising, erythema, lesion or rash.  Neurological:     Mental Status: He is alert and oriented to person, place, and time.     Cranial Nerves: No cranial nerve deficit.     Sensory: No sensory deficit.     Motor: No weakness.     Coordination: Coordination normal.     Gait: Gait normal.  Psychiatric:        Mood and Affect: Mood normal.        Speech: Speech normal.        Behavior: Behavior normal.        Thought Content: Thought content normal.        Judgment: Judgment normal.    Labs reviewed: Recent Labs    04/24/22 0907  NA 137  K 4.6  CL 107  CO2 25  GLUCOSE 92  BUN 23  CREATININE 0.87  CALCIUM 10.9*   Recent Labs    04/24/22 0907  AST 9*  ALT 8*  BILITOT 0.6  PROT 7.0   Recent Labs    04/24/22 0907  WBC 5.8  NEUTROABS 3,660  HGB 14.7  HCT 43.2  MCV 84.9   PLT 354   No results found for: "TSH" No results found for: "HGBA1C" Lab Results  Component Value Date   CHOL 131 04/24/2022   HDL 30 (L) 04/24/2022   LDLCALC 82 04/24/2022   TRIG 94 04/24/2022   CHOLHDL 4.4 04/24/2022    Significant Diagnostic Results in last 30 days:  No results found.  Assessment/Plan  1. Upper respiratory tract infection due to COVID-19 virus Reports cough,runny nose and nasal congestion - POC COVID-19 results positive  - start on Paxlovid as below.side effects discussed.Not on any other medication.  - Also advised to take Vitamin D,C and Zinc for supportive care  - continue on OTC tylenol - Mucinex for cough - encouraged to increase fluid intake   - nirmatrelvir/ritonavir (PAXLOVID) 20 x 150 MG & 10 x 100MG TABS; Take 3 tablets by mouth 2 (two) times daily for 5 days. (Take nirmatrelvir 150 mg two tablets twice daily for 5 days and ritonavir 100 mg one tablet twice daily for 5 days) Patient GFR is 94  Dispense: 30 tablet; Refill: 0 - Cholecalciferol (VITAMIN D3) 50 MCG (2000 UT) CAPS; Take 1 capsule (2,000 Units total) by mouth daily for 14 days.  Dispense: 14 capsule; Refill: 0 - zinc gluconate 50 MG tablet; Take 1 tablet (50 mg total) by mouth daily for 14 days.  Dispense: 14 tablet; Refill: 0 - vitamin C (ASCORBIC ACID) 250 MG tablet; Take 1 tablet (250 mg total) by mouth 2 (two) times daily for 14 days.  Dispense: 28 tablet; Refill: 0 - guaiFENesin (MUCINEX) 600 MG 12 hr tablet; Take 1 tablet (600 mg total) by mouth 2 (two) times daily for 10 days.  Dispense: 20 tablet; Refill: 0  2. COVID-19 virus RNA test result positive at limit of detection Afebrile  - nirmatrelvir/ritonavir (PAXLOVID) 20 x 150  MG & 10 x 100MG TABS; Take 3 tablets by mouth 2 (two) times daily for 5 days. (Take nirmatrelvir 150 mg two tablets twice daily for 5 days and ritonavir 100 mg one tablet twice daily for 5 days) Patient GFR is 94  Dispense: 30 tablet; Refill: 0 - Advised to  notify provider or go to ED if you develop any chest tightness,chest pain or shortness of breath  - Continue to practice social distance,self quarantine for 5 days,wear facial mask,hand hygiene and wipe counter top surfaces per CDC guidelines   3. Pain in left testicle Left testicle tender to palpation without any erythema or swelling.  - advised to go to ED if symptoms worsen - Ambulatory referral to Urology  Family/ staff Communication: Reviewed plan of care with patient and daughter verbalized understanding.  Labs/tests ordered: - POC COVID-19  Next Appointment:Return if symptoms worsen or fail to improve.    Sandrea Hughs, NP

## 2022-07-06 NOTE — Progress Notes (Signed)
No egg or soy allergy known to patient  No issues known to pt with past sedation with any surgeries or procedures Patient denies ever being told they had issues or difficulty with intubation  No FH of Malignant Hyperthermia Pt is not on diet pills Pt is not on  home 02  Pt is not on blood thinners  Pt denies issues with constipation  No A fib or A flutter Have any cardiac testing pending--NO  Pt instructed to use Singlecare.com or GoodRx for a price reduction on prep   Patient's chart reviewed by Osvaldo Angst CNRA prior to previsit and patient appropriate for the [place].  Previsit completed and red dot placed by patient's name on their procedure day (on provider's schedule).

## 2022-07-06 NOTE — Patient Instructions (Addendum)
-   Take Zinc 50 mg tablet one by mouth daily for 14 days  - Take Vitamin C 1000 mg tablet one by mouth twice daily x 14 days - Increase vitamin D to 2000 units daily x 14 days  - Tylenol as needed for fever or body aches  -  Mucinex 600 mg tablet one twice daily for cough  - increase your fruits intake in your diet  - increase your water intake to 6-8 glasses of water daily  - Notify provider or go to ED if you develop any chest tightness,chest pain or shortness of breath  - Continue to practice social distance,self quarantine for 5 days,wear facial mask,hand hygiene and wipe counter top surfaces per CDC guidelines

## 2022-07-09 ENCOUNTER — Ambulatory Visit: Payer: 59 | Admitting: Urology

## 2022-07-09 ENCOUNTER — Encounter: Payer: Self-pay | Admitting: Orthopedic Surgery

## 2022-07-09 ENCOUNTER — Telehealth (INDEPENDENT_AMBULATORY_CARE_PROVIDER_SITE_OTHER): Payer: 59 | Admitting: Orthopedic Surgery

## 2022-07-09 DIAGNOSIS — L821 Other seborrheic keratosis: Secondary | ICD-10-CM

## 2022-07-09 DIAGNOSIS — U071 COVID-19: Secondary | ICD-10-CM | POA: Diagnosis not present

## 2022-07-09 NOTE — Patient Instructions (Signed)
[facility] - [phone]

## 2022-07-09 NOTE — Progress Notes (Signed)
Careteam: Patient Care Team: Ngetich, Nelda Bucks, NP as PCP - General (Family Medicine)  Seen by: Windell Moulding, AGNP-C  PLACE OF SERVICE:  [facility] [place] Directive information Does Patient Have a Medical Advance Directive?: No, Would patient like information on creating a medical advance directive?: No - Patient declined  No Known Allergies  Chief Complaint  Patient presents with   Acute Visit    Patient complains of suspicious mole on back.      HPI: Patient is a 67 y.o. male seen today via virtual visit due to skin mole.   Son present during encounter.   Daughter was rubbing lotion to back and noticed large mole to left upper back. He reports having mole for a long time. Mole will occasionally itch at times. Denies pain or drainage. He reports frequent exposure to the sun when he was younger. He has not always used sunscreen during those times. He admits to sunburn a few times in his life. Family requesting dermatology referral for checkup.   02/16 tested positive for covid. Denies symptoms today. He has one more day of paxlovid.     Review of Systems:  Review of Systems  Constitutional: Negative.   Respiratory:  Negative for cough, shortness of breath and wheezing.   Cardiovascular:  Negative for chest pain and leg swelling.  Skin: Negative.        Skin mole  Psychiatric/Behavioral:  Negative for depression. The patient is not nervous/anxious.     History reviewed. No pertinent past medical history. Past Surgical History:  Procedure Laterality Date   HERNIA REPAIR     Social History:   reports that he has quit smoking. His smoking use included cigarettes. He has never used smokeless tobacco. He reports current alcohol use of about 2.0 standard drinks of alcohol per week. He reports that he does not use drugs.  Family History  Problem Relation Age of Onset   Colon cancer Neg Hx    Colon polyps Neg Hx    Esophageal cancer Neg Hx    Rectal cancer Neg Hx     Stomach cancer Neg Hx     Medications: Patient's Medications  New Prescriptions   No medications on file  Previous Medications   CHOLECALCIFEROL (VITAMIN D3) 50 MCG (2000 UT) CAPS    Take 1 capsule (2,000 Units total) by mouth daily for 14 days.   NIRMATRELVIR/RITONAVIR (PAXLOVID) 20 X 150 MG & 10 X 100MG TABS    Take 3 tablets by mouth 2 (two) times daily for 5 days. (Take nirmatrelvir 150 mg two tablets twice daily for 5 days and ritonavir 100 mg one tablet twice daily for 5 days) Patient GFR is 94   VITAMIN C (ASCORBIC ACID) 250 MG TABLET    Take 1 tablet (250 mg total) by mouth 2 (two) times daily for 14 days.   ZINC GLUCONATE 50 MG TABLET    Take 1 tablet (50 mg total) by mouth daily for 14 days.  Modified Medications   No medications on file  Discontinued Medications   GUAIFENESIN (MUCINEX) 600 MG 12 HR TABLET    Take 1 tablet (600 mg total) by mouth 2 (two) times daily for 10 days.   IBUPROFEN (ADVIL) 800 MG TABLET    Take 800 mg by mouth every 8 (eight) hours as needed for mild pain or moderate pain.    Physical Exam:  There were no vitals filed for this visit. There is no height or weight  on file to calculate BMI. Wt Readings from Last 3 Encounters:  07/06/22 130 lb (59 kg)  07/06/22 136 lb (61.7 kg)  07/04/22 135 lb (61.2 kg)    Physical Exam Vitals (exam limited due to virtual visit) reviewed.  Constitutional:      General: He is not in acute distress. Skin:    Comments: Approx 1-2 cm round lesion to left upper back, brown color, no skin breakdown.   Neurological:     Mental Status: He is alert.     Labs reviewed: Basic Metabolic Panel: Recent Labs    04/24/22 0907  NA 137  K 4.6  CL 107  CO2 25  GLUCOSE 92  BUN 23  CREATININE 0.87  CALCIUM 10.9*   Liver Function Tests: Recent Labs    04/24/22 0907  AST 9*  ALT 8*  BILITOT 0.6  PROT 7.0   No results for input(s): "LIPASE", "AMYLASE" in the last 8760 hours. No results for input(s): "AMMONIA"  in the last 8760 hours. CBC: Recent Labs    04/24/22 0907  WBC 5.8  NEUTROABS 3,660  HGB 14.7  HCT 43.2  MCV 84.9  PLT 354   Lipid Panel: Recent Labs    04/24/22 0907  CHOL 131  HDL 30*  LDLCALC 82  TRIG 94  CHOLHDL 4.4   TSH: No results for input(s): "TSH" in the last 8760 hours. A1C: No results found for: "HGBA1C"   Assessment/Plan 1. Seborrheic keratosis - non tender, brown, round, raised  left upper back - unknown how long he has had - asymptomatic - reports more moles on skin and long h/o sun exposure  - recommend dermatology referral> discussed [facility]   2. COVID-19 virus RNA test result positive at limit of detection - 02/16 tested positive - asymptomatic today - paxlovid complete 02/20    Virtual Visit   I connected with Ramond Marrow via virtual visit and verified that I am speaking with the correct person using two identifiers.  [place] Patient: Kinta Aster Provider: Yvonna Alanis, NP    I discussed the limitations, risks, security and privacy concerns of performing an evaluation and management service by telephone and the availability of in person appointments. I also discussed with the patient that there may be a patient responsible charge related to this service. The patient expressed understanding and agreed to proceed.   I discussed the assessment and treatment plan with the patient. The patient was provided an opportunity to ask questions and all were answered. The patient agreed with the plan and demonstrated an understanding of the instructions.   The patient was advised to call back or seek an in-person evaluation if the symptoms worsen or if the condition fails to improve as anticipated.  I provided 12 minutes of face-to-face time during this encounter.  Zarrah Loveland Cleophas Dunker, [place] printed and mailed    Next appt: none Vertis Scheib [place], [facility] [phone]

## 2022-07-09 NOTE — Progress Notes (Signed)
   This service is provided via telemedicine  No vital signs collected/recorded due to the encounter was a telemedicine visit.   Location of patient (ex: home, work):  Home  Patient consents to a telephone visit:  Yes  Location of the provider (ex: office, home):  [employer].   Name of any referring provider:  Ngetich, Nelda Bucks, NP   Names of all persons participating in the telemedicine service and their role in the encounter:  Patient, Michael Underwood, Brand[place], Windell Moulding, NP.    Time spent on call:  8 minutes spent on the phone with Medical Assistant.

## 2022-07-10 ENCOUNTER — Ambulatory Visit: Payer: 59 | Admitting: Family

## 2022-07-23 ENCOUNTER — Encounter: Payer: Self-pay | Admitting: Gastroenterology

## 2022-07-23 ENCOUNTER — Ambulatory Visit (AMBULATORY_SURGERY_CENTER): Payer: 59 | Admitting: Gastroenterology

## 2022-07-23 VITALS — BP 110/76 | HR 58 | Temp 97.8°F | Resp 14 | Ht 59.0 in | Wt 134.2 lb

## 2022-07-23 DIAGNOSIS — Z1211 Encounter for screening for malignant neoplasm of colon: Secondary | ICD-10-CM | POA: Diagnosis not present

## 2022-07-23 MED ORDER — SODIUM CHLORIDE 0.9 % IV SOLN: 500.0000 mL | Freq: Once | INTRAVENOUS | Status: DC

## 2022-07-23 NOTE — Op Note (Signed)
[place] Patient Name: Michael Underwood Procedure Date: 07/23/2022 1:31 PM MRN: [id number] Endoscopist: Thornton Park MD, MD, [id number] Age: 67 Referring MD:  Date of Birth: 1955-08-23 Gender: Male Account #: [id number] Procedure:                Colonoscopy Indications:              Screening for colorectal malignant neoplasm, This                            is the patient's first colonoscopy                           No known family history of colon cancer or polyps Medicines:                Monitored Anesthesia Care Procedure:                Pre-Anesthesia Assessment:                           - Prior to the procedure, a History and Physical                            was performed, and patient medications and                            allergies were reviewed. The patient's tolerance of                            previous anesthesia was also reviewed. The risks                            and benefits of the procedure and the sedation                            options and risks were discussed with the patient.                            All questions were answered, and informed consent                            was obtained. Prior Anticoagulants: The patient has                            taken no anticoagulant or antiplatelet agents. ASA                            Grade Assessment: II - A patient with mild systemic                            disease. After reviewing the risks and benefits,                            the patient was deemed in satisfactory condition to  undergo the procedure.                           After obtaining informed consent, the colonoscope                            was passed under direct vision. Throughout the                            procedure, the patient's blood pressure, pulse, and                            oxygen saturations were monitored continuously. The                            CF [id number] was  introduced through the anus                            and advanced to the 2 cm into the ileum. A second                            forward view of the right colon was performed. The                            colonoscopy was performed without difficulty. The                            patient tolerated the procedure well. The quality                            of the bowel preparation was good. The terminal                            ileum, ileocecal valve, appendiceal orifice, and                            rectum were photographed. Scope In: 1:37:28 PM Scope Out: 1:49:48 PM Scope Withdrawal Time: 0 hours 10 minutes 3 seconds  Total Procedure Duration: 0 hours 12 minutes 20 seconds  Findings:                 The perianal and digital rectal examinations were                            normal.                           Multiple medium-mouthed and small-mouthed                            diverticula were found in the entire colon.                           The exam was otherwise without abnormality on  direct and retroflexion views except for small                            internal hemorrhoids. Complications:            No immediate complications. Estimated Blood Loss:     Estimated blood loss: none. Impression:               - Diverticulosis in the entire examined colon.                           - The examination was otherwise normal on direct                            and retroflexion views.                           - No specimens collected. Recommendation:           - Patient has a contact number available for                            emergencies. The signs and symptoms of potential                            delayed complications were discussed with the                            patient. Return to normal activities tomorrow.                            Written discharge instructions were provided to the                            patient.                            - High fiber diet.                           - Continue present medications.                           - Repeat colonoscopy in 10 years for surveillance,                            earlier with new symptoms.                           - Follow a high fiber diet. Drink at least 64                            ounces of water daily. Add a daily stool bulking                            agent such as psyllium (an exampled would be  Metamucil).                           - Emerging evidence supports eating a diet of                            fruits, vegetables, grains, calcium, and yogurt                            while reducing red meat and alcohol may reduce the                            risk of colon cancer.                           - Thank you for allowing me to be involved in your                            colon cancer prevention. Thornton Park MD, MD 07/23/2022 1:54:35 PM This report has been signed electronically.

## 2022-07-23 NOTE — Patient Instructions (Signed)
Resume all of your regular medications today.  Read all ofthe handouts given to you by your recovery room nurse.  YOU HAD AN ENDOSCOPIC PROCEDURE TODAY AT [facility]:   Refer to the procedure report that was given to you for any specific questions about what was found during the examination.  If the procedure report does not answer your questions, please call your gastroenterologist to clarify.  If you requested that your care partner not be given the details of your procedure findings, then the procedure report has been included in a sealed envelope for you to review at your convenience later.  YOU SHOULD EXPECT: Some feelings of bloating in the abdomen. Passage of more gas than usual.  Walking can help get rid of the air that was put into your GI tract during the procedure and reduce the bloating. If you had a lower endoscopy (such as a colonoscopy or flexible sigmoidoscopy) you may notice spotting of blood in your stool or on the toilet paper. If you underwent a bowel prep for your procedure, you may not have a normal bowel movement for a few days.  Please Note:  You might notice some irritation and congestion in your nose or some drainage.  This is from the oxygen used during your procedure.  There is no need for concern and it should clear up in a day or so.  SYMPTOMS TO REPORT IMMEDIATELY:  Following lower endoscopy (colonoscopy or flexible sigmoidoscopy):  Excessive amounts of blood in the stool  Significant tenderness or worsening of abdominal pains  Swelling of the abdomen that is new, acute  Fever of 100F or higher   For urgent or emergent issues, a gastroenterologist can be reached at any hour by calling [phone]. Do not use MyChart messaging for urgent concerns.    DIET:  We do recommend a small meal at first, but then you may proceed to your regular diet.  Drink plenty of fluids but you should avoid alcoholic beverages for 24 hours.  Try to eat a high fiber  diet, and drink plenty of water.  ACTIVITY:  You should plan to take it easy for the rest of today and you should NOT DRIVE or use heavy machinery until tomorrow (because of the sedation medicines used during the test).    FOLLOW UP: Our staff will call the number listed on your records the next business day following your procedure.  We will call around 7:15- 8:00 am to check on you and address any questions or concerns that you may have regarding the information given to you following your procedure. If we do not reach you, we will leave a message.      SIGNATURES/CONFIDENTIALITY: You and/or your care partner have signed paperwork which will be entered into your electronic medical record.  These signatures attest to the fact that that the information above on your After Visit Summary has been reviewed and is understood.  Full responsibility of the confidentiality of this discharge information lies with you and/or your care-partner.

## 2022-07-23 NOTE — Progress Notes (Signed)
Pt resting comfortably. VSS. Airway intact. SBAR complete to RN. All questions answered.   

## 2022-07-23 NOTE — Progress Notes (Signed)
   Referring Provider: Sandrea Hughs, NP Primary Care Physician:  Sandrea Hughs, NP  Indication for Colonoscopy:  Colon cancer screening   IMPRESSION:  Need for colon cancer screening Appropriate candidate for monitored anesthesia care  PLAN: Colonoscopy in the [place] today   HPI: Michael Underwood is a 67 y.o. male presents for screening colonoscopy.  No prior colonoscopy or colon cancer screening.  No known family history of colon cancer or polyps. No family history of uterine/endometrial cancer, pancreatic cancer or gastric/stomach cancer.   History reviewed. No pertinent past medical history.  Past Surgical History:  Procedure Laterality Date   HERNIA REPAIR      No current outpatient medications on file.   Current Facility-Administered Medications  Medication Dose Route Frequency Provider Last Rate Last Admin   0.9 %  sodium chloride infusion  500 mL Intravenous Once [place], MD        Allergies as of 07/23/2022   (No Known Allergies)    Family History  Problem Relation Age of Onset   Colon cancer Neg Hx    Colon polyps Neg Hx    Esophageal cancer Neg Hx    Rectal cancer Neg Hx    Stomach cancer Neg Hx      Physical Exam: General:   Alert,  well-nourished, pleasant and cooperative in NAD Head:  Normocephalic and atraumatic. Eyes:  Sclera clear, no icterus.   Conjunctiva pink. Mouth:  No deformity or lesions.   Neck:  Supple; no masses or thyromegaly. Lungs:  Clear throughout to auscultation.   No wheezes. Heart:  Regular rate and rhythm; no murmurs. Abdomen:  Soft, non-tender, nondistended, normal bowel sounds, no rebound or guarding.  Msk:  Symmetrical. No boney deformities LAD: No inguinal or umbilical LAD Extremities:  No clubbing or edema. Neurologic:  Alert and  oriented x4;  grossly nonfocal Skin:  No obvious rash or bruise. Psych:  Alert and cooperative. Normal mood and affect.     Studies/Results: No results  found.    Michael Underwood L. Tarri Glenn, MD, MPH 07/23/2022, 1:23 PM

## 2022-07-23 NOTE — Progress Notes (Signed)
VS completed by CW.   Pt's states no medical or surgical changes since previsit or office visit.  

## 2022-07-24 ENCOUNTER — Telehealth: Payer: Self-pay

## 2022-07-24 NOTE — Telephone Encounter (Signed)
  Follow up Call-     07/23/2022    1:08 PM  Call back number  Post procedure Call Back phone  # [phone]  Permission to leave phone message Yes     Patient questions:  Do you have a fever, pain , or abdominal swelling? No. Pain Score  0 *  Have you tolerated food without any problems? Yes.    Have you been able to return to your normal activities? Yes.    Do you have any questions about your discharge instructions: Diet   No. Medications  No. Follow up visit  No.  Do you have questions or concerns about your Care? No.  Actions: * If pain score is 4 or above: No action needed, pain <4.

## 2022-07-25 DIAGNOSIS — L821 Other seborrheic keratosis: Secondary | ICD-10-CM | POA: Diagnosis not present

## 2022-07-25 DIAGNOSIS — D1801 Hemangioma of skin and subcutaneous tissue: Secondary | ICD-10-CM | POA: Diagnosis not present

## 2022-07-27 ENCOUNTER — Telehealth (INDEPENDENT_AMBULATORY_CARE_PROVIDER_SITE_OTHER): Payer: 59 | Admitting: Family

## 2022-07-27 DIAGNOSIS — N529 Male erectile dysfunction, unspecified: Secondary | ICD-10-CM

## 2022-07-27 MED ORDER — TADALAFIL 5 MG PO TABS: 5.0000 mg | ORAL_TABLET | Freq: Every day | ORAL | 11 refills | Status: DC

## 2022-07-27 NOTE — Progress Notes (Signed)
This service is provided via telemedicine  No vital signs collected/recorded due to the encounter was a telemedicine visit.   Location of patient (ex: home, work):  Home   Patient consents to a telephone visit:  Yes, March 8,2024  Location of the provider (ex: office, home):  [facility]  Name of any referring provider:  Gianny Killman, Nelda Bucks, NP   Names of all persons participating in the telemedicine service and their role in the encounter:, [employer], [place] ,Jasemine Nawaz C, NP and patient  Time spent on call:  11 minutes      Provider: Merril Isakson FNP-C  Laina Guerrieri, Nelda Bucks, NP  Patient Care Team: Alishba Naples, Nelda Bucks, NP as PCP - General (Family Medicine)  Extended Emergency Contact Information Primary Emergency Contact: Sassaman,INEZ Address: [address]  [place] Home Phone: [phone] Mobile Phone: [phone] Relation: Daughter Secondary Emergency Contact: [place] Mobile Phone: [phone] Relation: Daughter  Code Status:  Full Code  Goals of care: Advanced Directive information    07/27/2022    1:11 PM  Advanced Directives  Does Patient Have a Medical Advance Directive? No     Chief Complaint  Patient presents with   Medical Management of Chronic Issues    HPI:  Pt is a 67 y.o. male seen today for an acute visit for evaluation of ED.States having premature ejaculation during sexual activity with the wife.He request medication.symptoms are ongoing.   No past medical history on file. Past Surgical History:  Procedure Laterality Date   HERNIA REPAIR      No Known Allergies  Outpatient Encounter Medications as of 07/27/2022  Medication Sig   tadalafil (CIALIS) 5 MG tablet Take 1 tablet (5 mg total) by mouth daily.   No facility-administered encounter medications on file as of 07/27/2022.    Review of Systems  Constitutional:  Negative for appetite change, chills, fatigue and fever.   Gastrointestinal:  Negative for abdominal distention, abdominal pain, nausea, rectal pain and vomiting.  Genitourinary:  Negative for difficulty urinating, dysuria, flank pain, frequency, hematuria, penile discharge, penile pain, penile swelling, scrotal swelling, testicular pain and urgency.       Premature ejaculation   Skin:  Negative for color change, pallor and rash.    Immunization History  Administered Date(s) Administered   Moderna Sars-Covid-2 Vaccination 10/23/2019, 11/20/2019   Td 09/11/2013   Tdap 09/11/2013   Pertinent  Health Maintenance Due  Topic Date Due   INFLUENZA VACCINE  08/19/2022 (Originally 12/19/2021)   COLONOSCOPY (Pts 45-58yr Insurance coverage will need to be confirmed)  07/22/2032      07/04/2022    1:45 PM 07/04/2022    2:09 PM 07/06/2022   11:16 AM 07/09/2022    2:58 PM 07/27/2022    1:11 PM  FMontrosein the past year? 0 0 0 0 0  Was there an injury with Fall? 0 0 0 0 0  Fall Risk Category Calculator 0 0 0 0 0  Patient at Risk for Falls Due to No Fall Risks No Fall Risks No Fall Risks No Fall Risks No Fall Risks  Fall risk Follow up Falls evaluation completed Falls evaluation completed;Education provided Falls evaluation completed Falls evaluation completed Falls evaluation completed   Functional Status Survey:    There were no vitals filed for this visit. There is no height or weight on file to calculate BMI. Physical Exam Constitutional:  General: He is not in acute distress.    Appearance: He is not ill-appearing.  Pulmonary:     Effort: Pulmonary effort is normal. No respiratory distress.  Neurological:     Mental Status: He is alert and oriented to person, place, and time.     Gait: Gait normal.  Psychiatric:        Mood and Affect: Mood normal.        Behavior: Behavior normal.     Labs reviewed: Recent Labs    04/24/22 0907  NA 137  K 4.6  CL 107  CO2 25  GLUCOSE 92  BUN 23  CREATININE 0.87  CALCIUM 10.9*    Recent Labs    04/24/22 0907  AST 9*  ALT 8*  BILITOT 0.6  PROT 7.0   Recent Labs    04/24/22 0907  WBC 5.8  NEUTROABS 3,660  HGB 14.7  HCT 43.2  MCV 84.9  PLT 354   No results found for: "TSH" No results found for: "HGBA1C" Lab Results  Component Value Date   CHOL 131 04/24/2022   HDL 30 (L) 04/24/2022   LDLCALC 82 04/24/2022   TRIG 94 04/24/2022   CHOLHDL 4.4 04/24/2022    Significant Diagnostic Results in last 30 days:  No results found.  Assessment/Plan  Erectile dysfunction, unspecified erectile dysfunction type Reports worsening symptoms would like to try medication. - start on Cialis as below.side effect discussed.  - advised to notify provider if symptoms worsen or medication ineffective.will refer to urologist if needed.  - tadalafil (CIALIS) 5 MG tablet; Take 1 tablet (5 mg total) by mouth daily.  Dispense: 30 tablet; Refill: 11  I connected with  Vida Rigger on 07/27/22 by a video enabled telemedicine application and verified that I am speaking with the correct person using two identifiers.   I discussed the limitations of evaluation and management by telemedicine. The patient expressed understanding and agreed to proceed. Spent 11 minutes of face to face with patient  >50% time spent counseling; reviewing medical record;labs; and developing future plan of care.     Family/ staff Communication: Reviewed plan of care with patient  Labs/tests ordered: None   Next Appointment:   Sandrea Hughs, NP

## 2022-08-20 ENCOUNTER — Ambulatory Visit: Payer: 59 | Admitting: Urology

## 2022-10-17 ENCOUNTER — Encounter: Payer: Self-pay | Admitting: Family

## 2022-10-17 ENCOUNTER — Encounter: Payer: 59 | Admitting: Family

## 2022-10-17 NOTE — Progress Notes (Signed)
  This encounter was created in error - please disregard. No show 

## 2022-12-12 IMAGING — DX DG CERVICAL SPINE COMPLETE 4+V
5 series · 6 of 6 positions shown · non-contrast
Comparison: None.

CLINICAL DATA: Neck pain.

EXAM:
CERVICAL SPINE - COMPLETE 4+ VIEW

[c-spine lat]
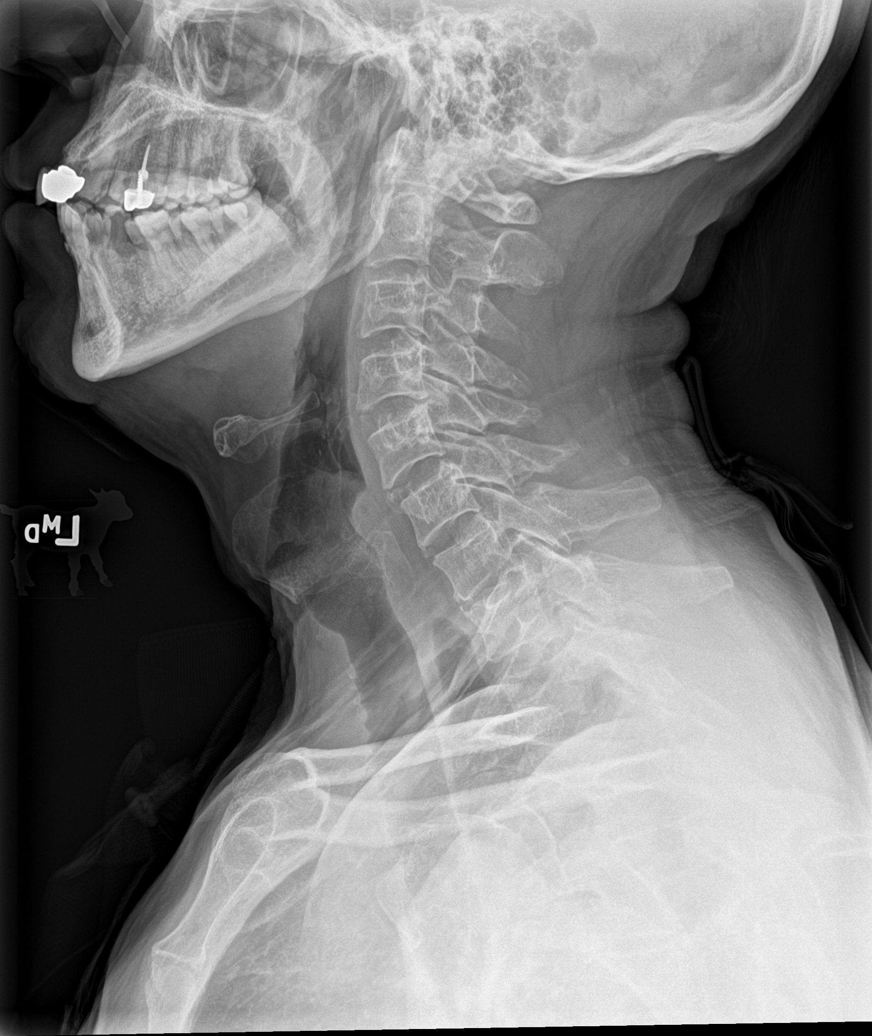

[c-spine obl (1 of 2)]
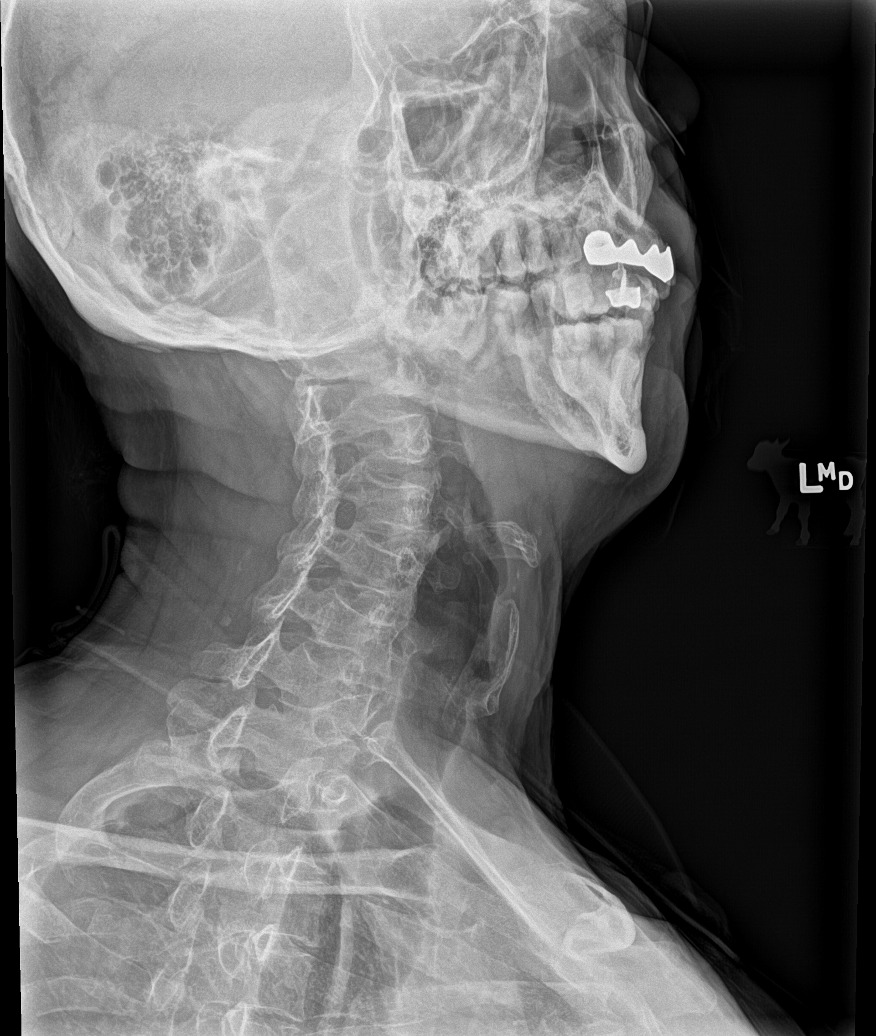

[c-spine obl (2 of 2)]
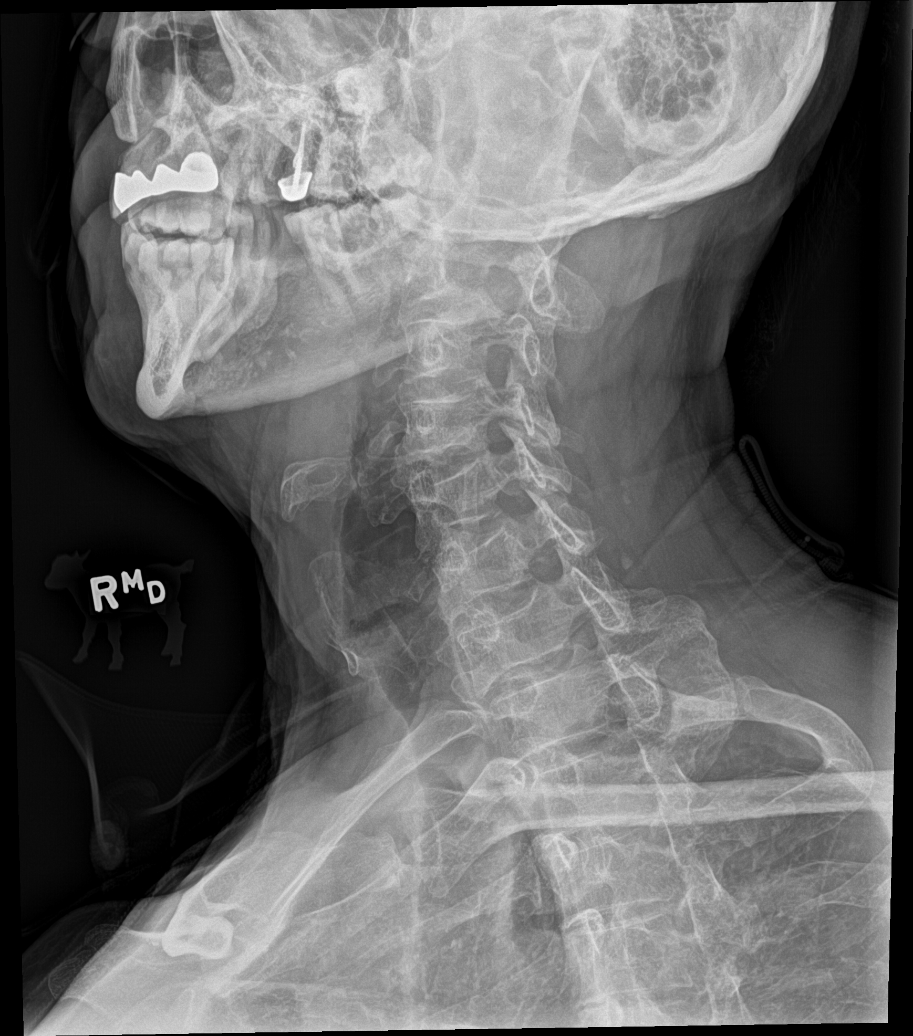

[c-spine ap]
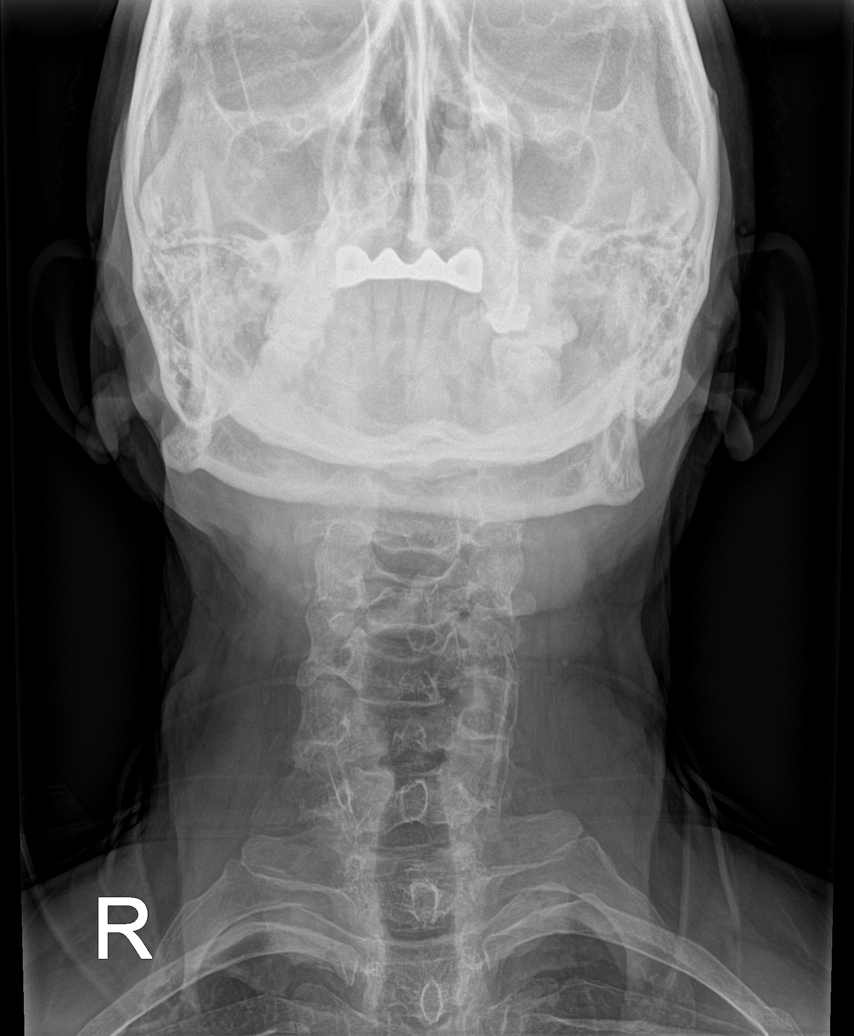

[Series 5: c-spine open mouth · 0.14mm/px · 2 of 2 slices shown]
[im 1/2]
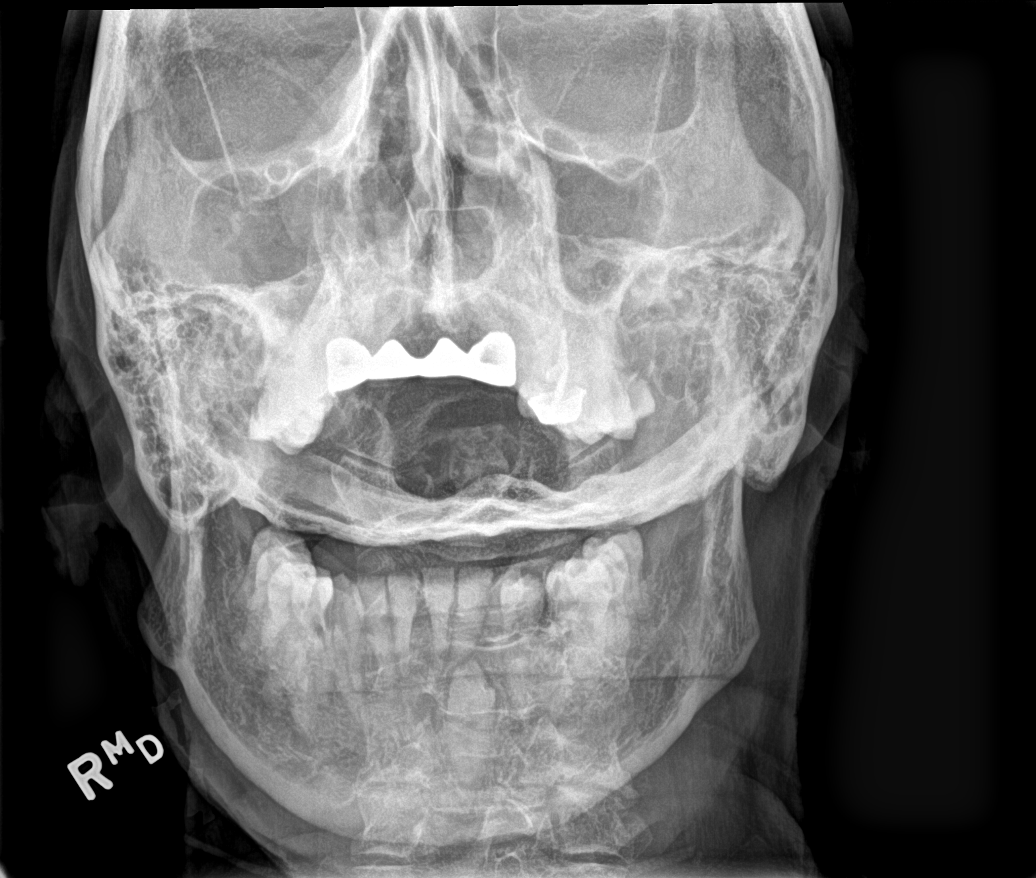
[im 2/2]
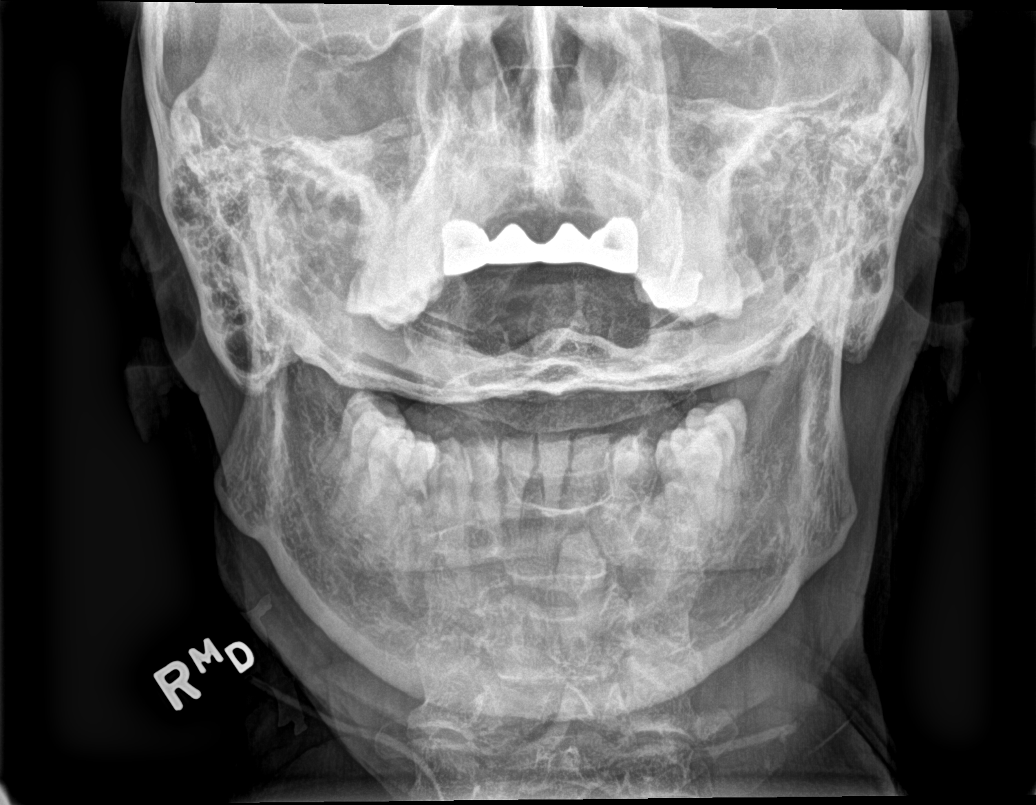

[6 of 6 positions shown; findings below may reference images not displayed]

FINDINGS: Normal alignment in the cervical spine. Vertebral body heights and
disc spaces are maintained. Prevertebral soft tissues are normal.
Negative for an acute fracture. Normal alignment at the
cervicothoracic junction. Mild degenerative facet and endplate
changes.
IMPRESSION: 1. No acute abnormality.
2. Mild degenerative changes.

## 2023-01-09 ENCOUNTER — Ambulatory Visit: Payer: 59 | Admitting: Family

## 2023-01-11 ENCOUNTER — Ambulatory Visit: Payer: 59 | Admitting: Family

## 2023-01-16 ENCOUNTER — Ambulatory Visit
Admission: RE | Admit: 2023-01-16 | Discharge: 2023-01-16 | Disposition: A | Discharge status: Home / Self Care | Payer: 59 | Source: Ambulatory Visit | Attending: Family | Admitting: Family

## 2023-01-16 ENCOUNTER — Ambulatory Visit (INDEPENDENT_AMBULATORY_CARE_PROVIDER_SITE_OTHER): Payer: 59 | Admitting: Family

## 2023-01-16 ENCOUNTER — Encounter: Payer: Self-pay | Admitting: Family

## 2023-01-16 VITALS — BP 127/89 | HR 88 | Temp 97.3°F | Resp 18 | Ht 59.0 in | Wt 135.2 lb

## 2023-01-16 DIAGNOSIS — Z125 Encounter for screening for malignant neoplasm of prostate: Secondary | ICD-10-CM | POA: Diagnosis not present

## 2023-01-16 DIAGNOSIS — M25511 Pain in right shoulder: Secondary | ICD-10-CM | POA: Diagnosis not present

## 2023-01-16 DIAGNOSIS — R300 Dysuria: Secondary | ICD-10-CM | POA: Diagnosis not present

## 2023-01-16 DIAGNOSIS — M79601 Pain in right arm: Secondary | ICD-10-CM | POA: Diagnosis not present

## 2023-01-16 MED ORDER — IBUPROFEN 600 MG PO TABS: 600.0000 mg | ORAL_TABLET | Freq: Three times a day (TID) | ORAL | 0 refills | Status: DC | PRN

## 2023-01-16 NOTE — Progress Notes (Signed)
Provider: Richarda Blade FNP-C  Sharlon Pfohl, Donalee Citrin, NP  Patient Care Team: Miciah Covelli, Donalee Citrin, NP as PCP - General (Family Medicine)  Extended Emergency Contact Information Primary Emergency Contact: Felber,INEZ Address: [address]          [place]  [address] Home Phone: [phone] Mobile Phone: [phone] Relation: Daughter Secondary Emergency Contact: Armentrout,Rebecca Mobile Phone: [phone] Relation: Daughter  Code Status:Full Code  Goals of care: Advanced Directive information    01/16/2023    9:37 AM  Advanced Directives  Does Patient Have a Medical Advance Directive? No  Would patient like information on creating a medical advance directive? No - Patient declined     Chief Complaint  Patient presents with   Acute Visit    pain in the right arm    Immunizations    Needs Shingrix, Pneumonia    HPI:  Pt is a 67 y.o. male seen today for an acute visit for evaluation of pain on right arm pain x 3 days.states pain was worst yesterday unable to raise hand.Has sharp pain when he raises hand.states some numbness on the fingers. He was unable to sleep on right side last night.states used to lift heavy loads at work but none recently.   Also complains frequent urination up to 4 times at night. Also has slight pain with urination. He denies any fever,chills,nausea,vomiting,abdominal pain,flank pain,urgency,difficult urination or hematuria, No history of  enlarged prostate.   History reviewed. No pertinent past medical history. Past Surgical History:  Procedure Laterality Date   HERNIA REPAIR      No Known Allergies  Outpatient Encounter Medications as of 01/16/2023  Medication Sig   tadalafil (CIALIS) 5 MG tablet Take 1 tablet (5 mg total) by mouth daily.   No facility-administered encounter medications on file as of 01/16/2023.    Review of Systems  Constitutional:  Negative for appetite change, chills, fatigue, fever and unexpected weight change.   Respiratory:  Negative for cough, chest tightness, shortness of breath and wheezing.   Cardiovascular:  Negative for chest pain, palpitations and leg swelling.  Gastrointestinal:  Negative for abdominal distention, abdominal pain, constipation, diarrhea, nausea and vomiting.  Genitourinary:  Positive for dysuria and frequency. Negative for difficulty urinating, flank pain and urgency.  Musculoskeletal:  Positive for arthralgias. Negative for back pain, gait problem, joint swelling, myalgias, neck pain and neck stiffness.       Right arm/shoulder pain   Skin:  Negative for color change, pallor, rash and wound.  Neurological:  Negative for dizziness, speech difficulty, weakness, light-headedness and headaches.       Reports some numbness on the right fingers  Hematological:  Does not bruise/bleed easily.  Psychiatric/Behavioral:  Negative for agitation, behavioral problems, confusion and sleep disturbance. The patient is not nervous/anxious.     Immunization History  Administered Date(s) Administered   Moderna Sars-Covid-2 Vaccination 10/23/2019, 11/20/2019   Td 09/11/2013   Tdap 09/11/2013   Pertinent  Health Maintenance Due  Topic Date Due   INFLUENZA VACCINE  12/20/2022   Colonoscopy  07/22/2032      07/06/2022   11:16 AM 07/09/2022    2:58 PM 07/27/2022    1:11 PM 10/17/2022    9:11 AM 01/16/2023    9:37 AM  Fall Risk  Falls in the past year? 0 0 0 0 0  Was there an injury with Fall? 0 0 0 0 0  Fall Risk Category Calculator 0 0 0 0 0  Patient at Risk for Falls Due  to No Fall Risks No Fall Risks No Fall Risks No Fall Risks No Fall Risks  Fall risk Follow up Falls evaluation completed Falls evaluation completed Falls evaluation completed Falls evaluation completed Falls evaluation completed   Functional Status Survey:    Vitals:   01/16/23 0935  BP: 127/89  Pulse: 88  Resp: 18  Temp: (!) 97.3 F (36.3 C)  SpO2: 98%  Weight: 135 lb 3.2 oz (61.3 kg)  Height: 4\' 11"  (1.499  m)   Body mass index is 27.31 kg/m. Physical Exam Vitals reviewed.  Constitutional:      General: He is not in acute distress.    Appearance: Normal appearance. He is overweight. He is not ill-appearing or diaphoretic.  HENT:     Head: Normocephalic.  Eyes:     General: No scleral icterus.       Right eye: No discharge.        Left eye: No discharge.     Conjunctiva/sclera: Conjunctivae normal.     Pupils: Pupils are equal, round, and reactive to light.  Cardiovascular:     Rate and Rhythm: Normal rate and regular rhythm.     Pulses: Normal pulses.     Heart sounds: Normal heart sounds. No murmur heard.    No friction rub. No gallop.  Pulmonary:     Effort: Pulmonary effort is normal. No respiratory distress.     Breath sounds: Normal breath sounds. No wheezing, rhonchi or rales.  Chest:     Chest wall: No tenderness.  Abdominal:     General: Bowel sounds are normal. There is no distension.     Palpations: Abdomen is soft. There is no mass.     Tenderness: There is no abdominal tenderness. There is no right CVA tenderness, left CVA tenderness, guarding or rebound.  Musculoskeletal:        General: No swelling. Normal range of motion.     Right shoulder: Normal.     Left shoulder: Normal.     Right upper arm: Tenderness present. No swelling, edema or deformity.     Left upper arm: Normal.     Right elbow: Normal.     Left elbow: Normal.     Right forearm: Normal.     Left forearm: Normal.     Right wrist: Normal.     Left wrist: Normal.     Right hand: Normal.     Left hand: Normal.     Cervical back: Normal range of motion. No rigidity or tenderness.     Right lower leg: No edema.     Left lower leg: No edema.     Comments: Biceps muscle tender to palpation.pain with ROM unable to raise hand above head or to the back.Grips strong and equal on both hands.  Lymphadenopathy:     Cervical: No cervical adenopathy.  Skin:    General: Skin is warm and dry.      Coloration: Skin is not pale.     Findings: No bruising, erythema, lesion or rash.  Neurological:     Mental Status: He is alert and oriented to person, place, and time.     Cranial Nerves: No cranial nerve deficit.     Sensory: No sensory deficit.     Motor: No weakness.     Coordination: Coordination normal.     Gait: Gait normal.  Psychiatric:        Mood and Affect: Mood normal.        Speech:  Speech normal.        Behavior: Behavior normal.     Labs reviewed: Recent Labs    04/24/22 0907  NA 137  K 4.6  CL 107  CO2 25  GLUCOSE 92  BUN 23  CREATININE 0.87  CALCIUM 10.9*   Recent Labs    04/24/22 0907  AST 9*  ALT 8*  BILITOT 0.6  PROT 7.0   Recent Labs    04/24/22 0907  WBC 5.8  NEUTROABS 3,660  HGB 14.7  HCT 43.2  MCV 84.9  PLT 354   No results found for: "TSH" No results found for: "HGBA1C" Lab Results  Component Value Date   CHOL 131 04/24/2022   HDL 30 (L) 04/24/2022   LDLCALC 82 04/24/2022   TRIG 94 04/24/2022   CHOLHDL 4.4 04/24/2022    Significant Diagnostic Results in last 30 days:  No results found.  Assessment/Plan 1. Acute pain of right shoulder Biceps muscle tender to palpation.pain with ROM unable to raise hand above head or to the back.Grips strong and equal on both hands. - ibuprofen (ADVIL) 600 MG tablet; Take 1 tablet (600 mg total) by mouth every 8 (eight) hours as needed.  Dispense: 30 tablet; Refill: 0 - Please get right shoulder/arm  X-ray at [facility] imaging at [facility] then will call you with results. - DG Humerus Right; Future - DG Shoulder Right; Future  2. Dysuria Afebrile Negative exam   - Urine Culture  3. Prostate cancer screening Reports voiding up to 4 times at night.will rule out BPH  - PSA, Total and Free   Family/ staff Communication: Reviewed plan of care with patient verbalized understanding   Labs/tests ordered:  - Urine Culture - PSA, Total and Free  Next Appointment:  Return if symptoms worsen or fail to improve.   Caesar Bookman, NP

## 2023-01-16 NOTE — Patient Instructions (Signed)
-   Please get right shoulder/arm  X-ray at [facility] imaging at [facility] then will call you with results.

## 2023-01-17 LAB — URINE CULTURE
MICRO NUMBER:: 15395146
Result:: NO GROWTH
SPECIMEN QUALITY:: ADEQUATE

## 2023-01-18 LAB — PSA, TOTAL AND FREE
PSA, % Free: 15 % — ABNORMAL LOW
PSA, Free: 0.4 ng/mL
PSA, Total: 2.6 ng/mL

## 2023-02-14 ENCOUNTER — Encounter: Payer: 59 | Admitting: Family

## 2023-02-14 NOTE — Progress Notes (Signed)
This encounter was created in error - please disregard. No show

## 2023-04-17 ENCOUNTER — Ambulatory Visit: Payer: 59

## 2023-04-23 ENCOUNTER — Ambulatory Visit (INDEPENDENT_AMBULATORY_CARE_PROVIDER_SITE_OTHER): Payer: 59 | Admitting: Family

## 2023-04-23 ENCOUNTER — Encounter: Payer: Self-pay | Admitting: Family

## 2023-04-23 VITALS — BP 125/78 | HR 57 | Temp 97.3°F | Resp 18 | Ht 59.0 in | Wt 138.4 lb

## 2023-04-23 DIAGNOSIS — R0789 Other chest pain: Secondary | ICD-10-CM

## 2023-04-23 DIAGNOSIS — N50811 Right testicular pain: Secondary | ICD-10-CM | POA: Diagnosis not present

## 2023-04-23 NOTE — Progress Notes (Unsigned)
Provider: Richarda Blade FNP-C  Tessla Spurling, Donalee Citrin, NP  Patient Care Team: Janace Decker, Donalee Citrin, NP as PCP - General (Family Medicine)  Extended Emergency Contact Information Primary Emergency Contact: Radel,INEZ Address: [address]          [place]  [address] Home Phone: [phone] Mobile Phone: [phone] Relation: Daughter Secondary Emergency Contact: Coats,Rebecca Mobile Phone: [phone] Relation: Daughter  Code Status:  Full Code  Goals of care: Advanced Directive information    01/16/2023    9:37 AM  Advanced Directives  Does Patient Have a Medical Advance Directive? No  Would patient like information on creating a medical advance directive? No - Patient declined     Chief Complaint  Patient presents with  . Acute Visit    breathing and pressure in heart problem has had for a few months     HPI:  Pt is a 67 y.o. male seen today for an acute visit for evaluation of left pressure x 2 months.states feels like running out of air especially at work and when sexually active with wife. He denies any radiation to arm or jaw.Has had some dizziness sometimes.She denies any fever,chills,cough,fatigue,body aches,runny nose,chest tightness,chest pain,palpitation or shortness of breath.  Also complains of right side testicle sharp pain which comes and goes.     History reviewed. No pertinent past medical history. Past Surgical History:  Procedure Laterality Date  . HERNIA REPAIR      No Known Allergies  Outpatient Encounter Medications as of 04/23/2023  Medication Sig  . ibuprofen (ADVIL) 600 MG tablet Take 1 tablet (600 mg total) by mouth every 8 (eight) hours as needed.  . tadalafil (CIALIS) 5 MG tablet Take 1 tablet (5 mg total) by mouth daily.   No facility-administered encounter medications on file as of 04/23/2023.    Review of Systems  Constitutional:  Negative for appetite change, chills, fatigue, fever and unexpected weight change.  HENT:   Negative for congestion, dental problem, ear discharge, ear pain, facial swelling, hearing loss, nosebleeds, postnasal drip, rhinorrhea, sinus pressure, sinus pain, sneezing, sore throat, tinnitus and trouble swallowing.   Eyes:  Negative for pain, discharge, redness, itching and visual disturbance.  Respiratory:  Negative for cough, chest tightness, shortness of breath and wheezing.   Cardiovascular:  Negative for chest pain, palpitations and leg swelling.  Gastrointestinal:  Negative for abdominal distention, abdominal pain, blood in stool, constipation, diarrhea, nausea and vomiting.  Endocrine: Negative for cold intolerance, heat intolerance, polydipsia, polyphagia and polyuria.  Genitourinary:  Negative for difficulty urinating, dysuria, flank pain, frequency and urgency.  Musculoskeletal:  Negative for arthralgias, back pain, gait problem, joint swelling, myalgias, neck pain and neck stiffness.  Skin:  Negative for color change, pallor, rash and wound.  Neurological:  Negative for dizziness, syncope, speech difficulty, weakness, light-headedness, numbness and headaches.  Hematological:  Does not bruise/bleed easily.  Psychiatric/Behavioral:  Negative for agitation, behavioral problems, confusion, hallucinations, self-injury, sleep disturbance and suicidal ideas. The patient is not nervous/anxious.     Immunization History  Administered Date(s) Administered  . Moderna Sars-Covid-2 Vaccination 10/23/2019, 11/20/2019  . Td 09/11/2013  . Tdap 09/11/2013   Pertinent  Health Maintenance Due  Topic Date Due  . INFLUENZA VACCINE  Never done  . Colonoscopy  07/22/2032      07/09/2022    2:58 PM 07/27/2022    1:11 PM 10/17/2022    9:11 AM 01/16/2023    9:37 AM 04/23/2023   11:32 AM  Fall Risk  Falls in  the past year? 0 0 0 0 0  Was there an injury with Fall? 0 0 0 0   Fall Risk Category Calculator 0 0 0 0   Patient at Risk for Falls Due to No Fall Risks No Fall Risks No Fall Risks No Fall  Risks No Fall Risks  Fall risk Follow up Falls evaluation completed Falls evaluation completed Falls evaluation completed Falls evaluation completed Falls evaluation completed   Functional Status Survey:    Vitals:   04/23/23 1132  BP: 125/78  Pulse: (!) 57  Resp: 18  Temp: (!) 97.3 F (36.3 C)  SpO2: 98%  Weight: 138 lb 6.4 oz (62.8 kg)  Height: 4\' 11"  (1.499 m)   Body mass index is 27.95 kg/m. Physical Exam  Labs reviewed: Recent Labs    04/24/22 0907  NA 137  K 4.6  CL 107  CO2 25  GLUCOSE 92  BUN 23  CREATININE 0.87  CALCIUM 10.9*   Recent Labs    04/24/22 0907  AST 9*  ALT 8*  BILITOT 0.6  PROT 7.0   Recent Labs    04/24/22 0907  WBC 5.8  NEUTROABS 3,660  HGB 14.7  HCT 43.2  MCV 84.9  PLT 354   No results found for: "TSH" No results found for: "HGBA1C" Lab Results  Component Value Date   CHOL 131 04/24/2022   HDL 30 (L) 04/24/2022   LDLCALC 82 04/24/2022   TRIG 94 04/24/2022   CHOLHDL 4.4 04/24/2022    Significant Diagnostic Results in last 30 days:  No results found.  Assessment/Plan There are no diagnoses linked to this encounter.   Family/ staff Communication: Reviewed plan of care with patient  Labs/tests ordered: None   Next Appointment:   Caesar Bookman, NP

## 2023-04-24 ENCOUNTER — Other Ambulatory Visit: Payer: 59

## 2023-04-24 DIAGNOSIS — R0789 Other chest pain: Secondary | ICD-10-CM

## 2023-04-24 LAB — CBC WITH DIFFERENTIAL/PLATELET
Absolute Lymphocytes: 1939 {cells}/uL (ref 850–3900)
Absolute Monocytes: 502 {cells}/uL (ref 200–950)
Basophils Absolute: 38 {cells}/uL (ref 0–200)
Basophils Relative: 0.7 %
Eosinophils Absolute: 221 {cells}/uL (ref 15–500)
Eosinophils Relative: 4.1 %
HCT: 46.7 % (ref 38.5–50.0)
Hemoglobin: 15.9 g/dL (ref 13.2–17.1)
MCH: 29.7 pg (ref 27.0–33.0)
MCHC: 34 g/dL (ref 32.0–36.0)
MCV: 87.3 fL (ref 80.0–100.0)
MPV: 10.7 fL (ref 7.5–12.5)
Monocytes Relative: 9.3 %
Neutro Abs: 2700 {cells}/uL (ref 1500–7800)
Neutrophils Relative %: 50 %
Platelets: 296 10*3/uL (ref 140–400)
RBC: 5.35 10*6/uL (ref 4.20–5.80)
RDW: 12.4 % (ref 11.0–15.0)
Total Lymphocyte: 35.9 %
WBC: 5.4 10*3/uL (ref 3.8–10.8)

## 2023-04-24 LAB — COMPLETE METABOLIC PANEL WITH GFR
AG Ratio: 1.2 (calc) (ref 1.0–2.5)
ALT: 13 U/L (ref 9–46)
AST: 12 U/L (ref 10–35)
Albumin: 3.8 g/dL (ref 3.6–5.1)
Alkaline phosphatase (APISO): 93 U/L (ref 35–144)
BUN: 20 mg/dL (ref 7–25)
CO2: 27 mmol/L (ref 20–32)
Calcium: 10.6 mg/dL — ABNORMAL HIGH (ref 8.6–10.3)
Chloride: 105 mmol/L (ref 98–110)
Creat: 0.9 mg/dL (ref 0.70–1.35)
Globulin: 3.2 g/dL (ref 1.9–3.7)
Glucose, Bld: 91 mg/dL (ref 65–99)
Potassium: 4.7 mmol/L (ref 3.5–5.3)
Sodium: 136 mmol/L (ref 135–146)
Total Bilirubin: 0.4 mg/dL (ref 0.2–1.2)
Total Protein: 7 g/dL (ref 6.1–8.1)
eGFR: 92 mL/min/{1.73_m2} (ref >=60)

## 2023-04-25 MED ORDER — ASPIRIN 81 MG PO TBEC: 81.0000 mg | DELAYED_RELEASE_TABLET | Freq: Every day | ORAL | Status: AC

## 2023-04-29 ENCOUNTER — Other Ambulatory Visit: Payer: Self-pay | Admitting: Family

## 2023-04-29 DIAGNOSIS — N529 Male erectile dysfunction, unspecified: Secondary | ICD-10-CM

## 2023-06-05 ENCOUNTER — Ambulatory Visit: Payer: 59 | Admitting: Urology

## 2023-08-14 ENCOUNTER — Ambulatory Visit: Payer: 59 | Admitting: Urology

## 2023-08-14 VITALS — BP 113/67 | HR 75

## 2023-08-14 DIAGNOSIS — R351 Nocturia: Secondary | ICD-10-CM | POA: Diagnosis not present

## 2023-08-14 DIAGNOSIS — N50811 Right testicular pain: Secondary | ICD-10-CM | POA: Diagnosis not present

## 2023-08-14 DIAGNOSIS — N529 Male erectile dysfunction, unspecified: Secondary | ICD-10-CM | POA: Diagnosis not present

## 2023-08-14 MED ORDER — ALFUZOSIN HCL ER 10 MG PO TB24: 10.0000 mg | ORAL_TABLET | Freq: Every day | ORAL | 11 refills | Status: DC

## 2023-08-14 MED ORDER — TADALAFIL 5 MG PO TABS: 5.0000 mg | ORAL_TABLET | Freq: Every day | ORAL | 0 refills | Status: DC | PRN

## 2023-08-14 NOTE — Progress Notes (Signed)
 08/14/2023 3:13 PM   Michael Underwood 1955-10-14 [id number]  Referring provider: Caesar Bookman, NP [address] [place],  [place] [address]  Testicular pain   HPI: Michael Underwood is a 67yo here for evaluation of testicular pain and nocturia. IPSS 7 QOL 2 on no BPH meds. He has nocturia 3x which is bothersome. Urine stream fair. NO straining to urinate.He has intermittent right testicular pain that is worse with activity and lifting. The pain is sharp, intermtitent mild to moderate and nonraditing.  No other associated symptoms. He has isseus getting and maintaining an erection for the past 3 years. No prior PDE5s. Good exercise tolerance   PMH: No past medical history on file.  Surgical History: Past Surgical History:  Procedure Laterality Date   HERNIA REPAIR      Home Medications:  Allergies as of 08/14/2023   No Known Allergies      Medication List        Accurate as of August 14, 2023  3:13 PM. If you have any questions, ask your nurse or doctor.          aspirin EC 81 MG tablet Take 1 tablet (81 mg total) by mouth daily. Swallow whole.   ibuprofen 600 MG tablet Commonly known as: ADVIL Take 1 tablet (600 mg total) by mouth every 8 (eight) hours as needed.   tadalafil 5 MG tablet Commonly known as: CIALIS TAKE 1 TABLET(5 MG) BY MOUTH DAILY        Allergies: No Known Allergies  Family History: Family History  Problem Relation Age of Onset   Colon cancer Neg Hx    Colon polyps Neg Hx    Esophageal cancer Neg Hx    Rectal cancer Neg Hx    Stomach cancer Neg Hx     Social History:  reports that he has quit smoking. His smoking use included cigarettes. He has never used smokeless tobacco. He reports current alcohol use of about 2.0 standard drinks of alcohol per week. He reports that he does not use drugs.  ROS: All other review of systems were reviewed and are negative except what is noted above in HPI  Physical Exam: BP 113/67   Pulse 75    Constitutional:  Alert and oriented, No acute distress. HEENT: [place] AT, moist mucus membranes.  Trachea midline, no masses. Cardiovascular: No clubbing, cyanosis, or edema. Respiratory: Normal respiratory effort, no increased work of breathing. GI: Abdomen is soft, nontender, nondistended, no abdominal masses GU: No CVA tenderness.  Lymph: No cervical or inguinal lymphadenopathy. Skin: No rashes, bruises or suspicious lesions. Neurologic: Grossly intact, no focal deficits, moving all 4 extremities. Psychiatric: Normal mood and affect.  Laboratory Data: Lab Results  Component Value Date   WBC 5.4 04/24/2023   HGB 15.9 04/24/2023   HCT 46.7 04/24/2023   MCV 87.3 04/24/2023   PLT 296 04/24/2023    Lab Results  Component Value Date   CREATININE 0.90 04/24/2023    No results found for: "PSA"  No results found for: "TESTOSTERONE"  No results found for: "HGBA1C"  Urinalysis    Component Value Date/Time   COLORURINE YELLOW 03/30/2018 2250   APPEARANCEUR CLOUDY (A) 03/30/2018 2250   LABSPEC 1.020 03/30/2018 2250   PHURINE 6.0 03/30/2018 2250   GLUCOSEU 50 (A) 03/30/2018 2250   HGBUR LARGE (A) 03/30/2018 2250   BILIRUBINUR Negative 04/17/2022 1123   KETONESUR negative 07/03/2021 0859   KETONESUR 20 (A) 03/30/2018 2250   PROTEINUR Positive (A) 04/17/2022  1123   PROTEINUR 100 (A) 03/30/2018 2250   UROBILINOGEN negative (A) 04/17/2022 1123   NITRITE Negative 04/17/2022 1123   NITRITE NEGATIVE 03/30/2018 2250   LEUKOCYTESUR Trace (A) 04/17/2022 1123    Lab Results  Component Value Date   BACTERIA NONE SEEN 03/30/2018    Pertinent Imaging:  No results found for this or any previous visit.  No results found for this or any previous visit.  No results found for this or any previous visit.  No results found for this or any previous visit.  No results found for this or any previous visit.  No results found for this or any previous visit.  No results found for this  or any previous visit.  No results found for this or any previous visit.   Assessment & Plan:    1. Erectile dysfunction Tadalafil 5mg  daily - Urinalysis, Routine w reflex microscopic  2. Nocturia Uroxatral 10mg  daily   No follow-ups on file.  Wilkie Aye, MD  [facility] [place]

## 2023-08-15 ENCOUNTER — Ambulatory Visit (INDEPENDENT_AMBULATORY_CARE_PROVIDER_SITE_OTHER): Admitting: Orthopedic Surgery

## 2023-08-15 ENCOUNTER — Ambulatory Visit: Payer: Self-pay

## 2023-08-15 ENCOUNTER — Ambulatory Visit: Admitting: Adult Health

## 2023-08-15 ENCOUNTER — Encounter: Payer: Self-pay | Admitting: Orthopedic Surgery

## 2023-08-15 VITALS — BP 110/70 | HR 76 | Temp 97.9°F | Resp 16 | Ht 59.0 in

## 2023-08-15 DIAGNOSIS — M25552 Pain in left hip: Secondary | ICD-10-CM | POA: Diagnosis not present

## 2023-08-15 LAB — URINALYSIS, ROUTINE W REFLEX MICROSCOPIC
Bilirubin, UA: NEGATIVE
Glucose, UA: NEGATIVE
Ketones, UA: NEGATIVE
Leukocytes,UA: NEGATIVE
Nitrite, UA: NEGATIVE
Protein,UA: NEGATIVE
RBC, UA: NEGATIVE
Specific Gravity, UA: 1.03 (ref 1.005–1.030)
Urobilinogen, Ur: 0.2 mg/dL (ref 0.2–1.0)
pH, UA: 6 (ref 5.0–7.5)

## 2023-08-15 MED ORDER — PREDNISONE 20 MG PO TABS: ORAL_TABLET | ORAL | 0 refills | Status: DC

## 2023-08-15 NOTE — Telephone Encounter (Signed)
 Chief Complaint: leg pain  Symptoms: leg pain difficulty walking Frequency: this morning Pertinent Negatives: Patient denies swelling Disposition: [] ED /[] Urgent Care (no appt availability in office) / [x] Appointment(In office/virtual)/ []  [facility]/ [] Home Care/ [] Refused Recommended Disposition /[] [facility]/ []  Follow-up with PCP Additional Notes: Patients daughter called stating that patient woke up this morning with leg pain they elevated his leg and gave him acetaminophen and he went back to sleep. States that when he woke up again the pain was back and he has difficulty walking on that leg, denies swelling, numbness tingling or injury.  Patient states pain 10/10.  When walking.   Copied from CRM [id number]. Topic: Clinical - Red Word Triage >> Aug 15, 2023  8:31 AM Hamdi H wrote: [facility] that prompted transfer to Nurse Triage: Patients legs hurting, can't move them well, and having a hard time walking. This symptom started this morning at 5 AM. Reason for Disposition  [1] SEVERE pain (e.g., excruciating, unable to do any normal activities) AND [2] not improved after 2 hours of pain medicine  Answer Assessment - Initial Assessment Questions 1. ONSET: "When did the pain start?"      This morning around 5 am 2. LOCATION: "Where is the pain located?"      Left leg 3. PAIN: "How bad is the pain?"    (Scale 1-10; or mild, moderate, severe)   -  MILD (1-3): doesn't interfere with normal activities    -  MODERATE (4-7): interferes with normal activities (e.g., work or school) or awakens from sleep, limping    -  SEVERE (8-10): excruciating pain, unable to do any normal activities, unable to walk     10/10 4. WORK OR EXERCISE: "Has there been any recent work or exercise that involved this part of the body?"      no 5. CAUSE: "What do you think is causing the leg pain?"     Was working in the yard 6. OTHER SYMPTOMS: "Do you have any other symptoms?" (e.g., chest pain,  back pain, breathing difficulty, swelling, rash, fever, numbness, weakness)     no  Protocols used: Leg Pain-A-AH

## 2023-08-15 NOTE — Progress Notes (Signed)
 Careteam: Patient Care Team: Michael Underwood, Donalee Citrin, NP as PCP - General (Family Medicine)  Seen by: Hazle Nordmann, AGNP-C  PLACE OF SERVICE:  [facility]  Advanced Directive information Does Patient Have a Medical Advance Directive?: Yes, Type of Advance Directive: Healthcare Power of [place];Living will, Does patient want to make changes to medical advance directive?: No - Patient declined  No Known Allergies  Chief Complaint  Patient presents with   Leg Pain    Patient complains of pain in left leg making it difficult to walk.      HPI: Patient is a 68 y.o. male seen today for acute visit due to left hip pain.   Discussed the use of AI scribe software for clinical note transcription with the patient, who gave verbal consent to proceed.   He is accompanied by his daughter.  He has been experiencing acute left leg pain that began last night when he went to bed and intensified to a sharp pain by 5 AM this morning. The pain is located in the left leg, particularly affecting the hip and radiating down to left groin. Pain rated 5/10, described as sharp. Pain increased with standing or moving. No recent falls or injuries are reported. Denies past left hip surgery. Denies arthritic pain to left leg in past. Admits to  cleaning activities the day before the pain started, which involved using a water hose and vacuum. Daughter gave him tylenol 500 mg this morning and pain improved some. Advil also listed on medication list.      Review of Systems:  Review of Systems  Constitutional: Negative.   Respiratory: Negative.    Cardiovascular: Negative.   Musculoskeletal:  Positive for joint pain. Negative for back pain, falls and myalgias.  Neurological:  Positive for weakness. Negative for sensory change.  Psychiatric/Behavioral:  Negative for depression. The patient is not nervous/anxious.     History reviewed. No pertinent past medical history. Past Surgical History:  Procedure Laterality  Date   HERNIA REPAIR     Social History:   reports that he has quit smoking. His smoking use included cigarettes. He has never used smokeless tobacco. He reports current alcohol use of about 2.0 standard drinks of alcohol per week. He reports that he does not use drugs.  Family History  Problem Relation Age of Onset   Colon cancer Neg Hx    Colon polyps Neg Hx    Esophageal cancer Neg Hx    Rectal cancer Neg Hx    Stomach cancer Neg Hx     Medications: Patient's Medications  New Prescriptions   No medications on file  Previous Medications   ALFUZOSIN (UROXATRAL) 10 MG 24 HR TABLET    Take 1 tablet (10 mg total) by mouth at bedtime.   ASPIRIN EC 81 MG TABLET    Take 1 tablet (81 mg total) by mouth daily. Swallow whole.   IBUPROFEN (ADVIL) 600 MG TABLET    Take 1 tablet (600 mg total) by mouth every 8 (eight) hours as needed.   TADALAFIL (CIALIS) 5 MG TABLET    TAKE 1 TABLET(5 MG) BY MOUTH DAILY   TADALAFIL (CIALIS) 5 MG TABLET    Take 1 tablet (5 mg total) by mouth daily as needed for erectile dysfunction.   TAMSULOSIN (FLOMAX) 0.4 MG CAPS CAPSULE    Take 0.4 mg by mouth as needed.  Modified Medications   No medications on file  Discontinued Medications   No medications on file  Physical Exam:  Vitals:   08/15/23 0944  BP: 110/70  Pulse: 76  Resp: 16  Temp: 97.9 F (36.6 C)  SpO2: 98%  Height: 4\' 11"  (1.499 m)   Body mass index is 27.95 kg/m. Wt Readings from Last 3 Encounters:  04/23/23 138 lb 6.4 oz (62.8 kg)  01/16/23 135 lb 3.2 oz (61.3 kg)  07/23/22 134 lb 3.2 oz (60.9 kg)    Physical Exam Vitals reviewed.  Constitutional:      General: He is not in acute distress. HENT:     Head: Normocephalic.  Eyes:     General:        Right eye: No discharge.        Left eye: No discharge.  Cardiovascular:     Rate and Rhythm: Normal rate and regular rhythm.     Pulses: Normal pulses.     Heart sounds: Normal heart sounds.  Pulmonary:     Effort: Pulmonary  effort is normal.     Breath sounds: Normal breath sounds.  Abdominal:     Tenderness: There is no left CVA tenderness.  Musculoskeletal:        General: Tenderness present.     Lumbar back: No swelling, deformity or tenderness. Normal range of motion.     Left hip: Tenderness present. No crepitus. Decreased range of motion. Normal strength.     Comments: Pain with left adduction/abduction and knee flexion  Skin:    General: Skin is warm.     Capillary Refill: Capillary refill takes less than 2 seconds.  Neurological:     General: No focal deficit present.     Mental Status: He is alert and oriented to person, place, and time.     Gait: Gait abnormal.  Psychiatric:        Mood and Affect: Mood normal.     Labs reviewed: Basic Metabolic Panel: Recent Labs    04/24/23 1014  NA 136  K 4.7  CL 105  CO2 27  GLUCOSE 91  BUN 20  CREATININE 0.90  CALCIUM 10.6*   Liver Function Tests: Recent Labs    04/24/23 1014  AST 12  ALT 13  BILITOT 0.4  PROT 7.0   No results for input(s): "LIPASE", "AMYLASE" in the last 8760 hours. No results for input(s): "AMMONIA" in the last 8760 hours. CBC: Recent Labs    04/24/23 1014  WBC 5.4  NEUTROABS 2,700  HGB 15.9  HCT 46.7  MCV 87.3  PLT 296   Lipid Panel: No results for input(s): "CHOL", "HDL", "LDLCALC", "TRIG", "CHOLHDL", "LDLDIRECT" in the last 8760 hours. TSH: No results for input(s): "TSH" in the last 8760 hours. A1C: No results found for: "HGBA1C"   Assessment/Plan 1. Acute hip pain, left (Primary) - onset x 1 day - increased pain with ambulation/movement - no recent fall or injury - left hip pain with adduction/abduction/ knee flexion  - try prednisone to decrease swelling - cont tylenol 1000 mg po TID prn - cont Advil> advised to take with food - recommend xray if pain has not improved in 2-3 weeks - predniSONE (DELTASONE) 20 MG tablet; Take 2 tablets (40 mg total) by mouth daily with breakfast for 5 days,  THEN 1 tablet (20 mg total) daily with breakfast for 3 days.  Dispense: 13 tablet; Refill: 0  Total time: 21 minutes. Greater than 50% of total time spent doing patient education regarding acute left hip pain including symptom/medication management.   Next appt: Visit date  not found  Kailan Laws Scherry Ran  [facility] [phone]

## 2023-08-15 NOTE — Patient Instructions (Addendum)
 Start prednisone for inflammation> 8 days worth> take in AM   Tylenol 1000 mg 1-3 times daily as needed for pain  Ibuprofen is also ok to take, but TAKE WITH FOOD   [place] pain patches over the counter may help  Light walking is encouraged  Please contact provider if pain does not improve in 2-3 weeks> recommend xray

## 2023-08-16 ENCOUNTER — Ambulatory Visit (INDEPENDENT_AMBULATORY_CARE_PROVIDER_SITE_OTHER): Admitting: Adult Health

## 2023-08-16 ENCOUNTER — Encounter: Payer: Self-pay | Admitting: Adult Health

## 2023-08-16 ENCOUNTER — Ambulatory Visit: Admitting: Family

## 2023-08-16 VITALS — Ht 59.0 in

## 2023-08-16 DIAGNOSIS — Z0000 Encounter for general adult medical examination without abnormal findings: Secondary | ICD-10-CM

## 2023-08-16 DIAGNOSIS — M25552 Pain in left hip: Secondary | ICD-10-CM

## 2023-08-16 NOTE — Progress Notes (Signed)
 Subjective:   Michael Underwood is a 68 y.o. male who presents for Medicare Annual/Subsequent preventive examination.  Visit Complete: Virtual I connected with  Michael Underwood on 08/16/23 by a video and audio enabled telemedicine application and verified that I am speaking with the correct person using two identifiers.  Patient Location: Home  Provider Location: Office/Clinic  I discussed the limitations of evaluation and management by telemedicine. The patient expressed understanding and agreed to proceed.  Vital Signs: Because this visit was a virtual/telehealth visit, some criteria may be missing or patient reported. Any vitals not documented were not able to be obtained and vitals that have been documented are patient reported.  Patient Medicare AWV questionnaire was completed by the patient on 08/16/23; I have confirmed that all information answered by patient is correct and no changes since this date.        Objective:    Today's Vitals   08/16/23 1106  Height: 4\' 11"  (1.499 m)   Body mass index is 27.95 kg/m.     08/15/2023    9:58 AM 01/16/2023    9:37 AM 10/17/2022    9:09 AM 07/27/2022    1:11 PM 07/09/2022    2:58 PM 07/04/2022    1:44 PM 04/17/2022   10:00 AM  Advanced Directives  Does Patient Have a Medical Advance Directive? Yes No No No No No No  Type of Estate agent of [place];Living will        Does patient want to make changes to medical advance directive? No - Patient declined        Copy of Healthcare Power of Attorney in Chart? No - copy requested        Would patient like information on creating a medical advance directive?  No - Patient declined No - Patient declined  No - Patient declined No - Patient declined No - Patient declined    Current Medications (verified) Outpatient Encounter Medications as of 08/16/2023  Medication Sig   alfuzosin (UROXATRAL) 10 MG 24 hr tablet Take 1 tablet (10 mg total) by mouth at bedtime.   aspirin EC  81 MG tablet Take 1 tablet (81 mg total) by mouth daily. Swallow whole.   ibuprofen (ADVIL) 600 MG tablet Take 1 tablet (600 mg total) by mouth every 8 (eight) hours as needed.   predniSONE (DELTASONE) 20 MG tablet Take 2 tablets (40 mg total) by mouth daily with breakfast for 5 days, THEN 1 tablet (20 mg total) daily with breakfast for 3 days.   tadalafil (CIALIS) 5 MG tablet Take 1 tablet (5 mg total) by mouth daily as needed for erectile dysfunction.   tamsulosin (FLOMAX) 0.4 MG CAPS capsule Take 0.4 mg by mouth as needed.   No facility-administered encounter medications on file as of 08/16/2023.    Allergies (verified) Patient has no known allergies.   History: No past medical history on file. Past Surgical History:  Procedure Laterality Date   HERNIA REPAIR     Family History  Problem Relation Age of Onset   Colon cancer Neg Hx    Colon polyps Neg Hx    Esophageal cancer Neg Hx    Rectal cancer Neg Hx    Stomach cancer Neg Hx    Social History   Socioeconomic History   Marital status: Divorced    Spouse name: Not on file   Number of children: Not on file   Years of education: Not on file   Highest education level:  Not on file  Occupational History   Not on file  Tobacco Use   Smoking status: Former    Types: Cigarettes   Smokeless tobacco: Never  Vaping Use   Vaping status: Never Used  Substance and Sexual Activity   Alcohol use: Yes    Alcohol/week: 2.0 standard drinks of alcohol    Types: 2 Cans of beer per week    Comment: socially   Drug use: Never   Sexual activity: Not on file  Other Topics Concern   Not on file  Social History Narrative   Not on file   Social Drivers of Health   Financial Resource Strain: Medium Risk (07/04/2022)   Overall Financial Resource Strain (CARDIA)    Difficulty of Paying Living Expenses: Somewhat hard  Food Insecurity: No Food Insecurity (08/15/2023)   Hunger Vital Sign    Worried About Running Out of Food in the Last  Year: Never true    Ran Out of Food in the Last Year: Never true  Transportation Needs: No Transportation Needs (08/15/2023)   PRAPARE - Administrator, Civil Service (Medical): No    Lack of Transportation (Non-Medical): No  Physical Activity: Sufficiently Active (07/04/2022)   Exercise Vital Sign    Days of Exercise per Week: 7 days    Minutes of Exercise per Session: 30 min  Stress: No Stress Concern Present (07/04/2022)   [employer] of Occupational Health - Occupational Stress Questionnaire    Feeling of Stress : Not at all  Social Connections: Moderately Integrated (08/15/2023)   Social Connection and Isolation Panel [NHANES]    Frequency of Communication with Friends and Family: Three times a week    Frequency of Social Gatherings with Friends and Family: Three times a week    Attends Religious Services: More than 4 times per year    Active Member of Clubs or Organizations: Yes    Attends Engineer, structural: More than 4 times per year    Marital Status: Divorced    Tobacco Counseling Counseling given: Not Answered   Clinical Intake:  Pre-visit preparation completed: No  Pain : No/denies pain     BMI - recorded: 27.95 Nutritional Status: BMI 25 -29 Overweight Diabetes: No  How often do you need to have someone help you when you read instructions, pamphlets, or other written materials from your doctor or pharmacy?: 4 - Often What is the last grade level you completed in school?: 12th grade  Interpreter Needed?: Yes (Spanish interpreter) Patient Declined Interpreter : Yes (daughter interpreted in [place]) Patient signed [place] waiver: No  Information entered by :: Katena Petitjean Medina-Vargas DNP   Activities of Daily Living     No data to display          Patient Care Team: Ngetich, Donalee Citrin, NP as PCP - General (Family Medicine)  Indicate any recent Medical Services you may have received from other than [facility] providers in the past  year (date may be approximate).     Assessment:   This is a routine wellness examination for Nora.  Hearing/Vision screen Hearing Screening - Comments:: Issue with hearing. Vision Screening - Comments:: [facility] in [place]   Goals Addressed             This Visit's Progress    Exercise 3x per week (30 min per time)       - tries to be active in the house -  stretching all four extremities  Depression Screen    08/16/2023   11:25 AM 08/16/2023   11:11 AM 08/15/2023    9:59 AM 04/23/2023   11:32 AM 01/16/2023    9:37 AM 07/04/2022    1:46 PM 04/17/2022   10:36 AM  PHQ 2/9 Scores  PHQ - 2 Score 0 0 0 0 0 0 1  PHQ- 9 Score     0 0   Exception Documentation      Other- indicate reason in comment box   Not completed      AWV     Fall Risk    08/16/2023   11:24 AM 08/16/2023   11:09 AM 08/15/2023    9:58 AM 04/23/2023   11:32 AM 01/16/2023    9:37 AM  Fall Risk   Falls in the past year?  0 0 0 0  Number falls in past yr:  0 0 0 0  Injury with Fall?  0 0  0  Risk for fall due to : No Fall Risks No Fall Risks No Fall Risks No Fall Risks No Fall Risks  Follow up Falls evaluation completed Falls evaluation completed Falls evaluation completed Falls evaluation completed Falls evaluation completed    MEDICARE RISK AT HOME:    TIMED UP AND GO:  Was the test performed?  No    Cognitive Function:    07/04/2022    2:09 PM  MMSE - Mini Mental State Exam  Not completed: Refused        08/16/2023   11:11 AM 07/04/2022    1:47 PM  6CIT Screen  What Year? 0 points 0 points  What month? 0 points 0 points  What time? 0 points 0 points  Count back from 20 2 points 0 points  Months in reverse 4 points 0 points  Repeat phrase 4 points 4 points  Total Score 10 points 4 points    Immunizations Immunization History  Administered Date(s) Administered   Moderna Sars-Covid-2 Vaccination 10/23/2019, 11/20/2019   Td 09/11/2013   Tdap 09/11/2013    TDAP status: Up  to date  Flu Vaccine status: Due, Education has been provided regarding the importance of this vaccine. Advised may receive this vaccine at local pharmacy or Health Dept. Aware to provide a copy of the vaccination record if obtained from local pharmacy or Health Dept. Verbalized acceptance and understanding.  Pneumococcal vaccine status: Due, Education has been provided regarding the importance of this vaccine. Advised may receive this vaccine at local pharmacy or Health Dept. Aware to provide a copy of the vaccination record if obtained from local pharmacy or Health Dept. Verbalized acceptance and understanding.  Covid-19 vaccine status: Information provided on how to obtain vaccines.   Qualifies for Shingles Vaccine? Yes   Zostavax completed No   Shingrix Completed?: No.    Education has been provided regarding the importance of this vaccine. Patient has been advised to call insurance company to determine out of pocket expense if they have not yet received this vaccine. Advised may also receive vaccine at local pharmacy or Health Dept. Verbalized acceptance and understanding.  Screening Tests Health Maintenance  Topic Date Due   Pneumonia Vaccine 58+ Years old (1 of 1 - PCV) Never done   INFLUENZA VACCINE  Never done   Zoster Vaccines- Shingrix (1 of 2) 08/23/2023 (Originally 12/02/1974)   COVID-19 Vaccine (3 - Moderna risk series) 09/01/2023 (Originally 12/18/2019)   DTaP/Tdap/Td (3 - Td or Tdap) 09/12/2023   Medicare Annual Wellness (AWV)  08/15/2024  Colonoscopy  07/22/2032   Hepatitis C Screening  Completed   HPV VACCINES  Aged Out    Health Maintenance  Health Maintenance Due  Topic Date Due   Pneumonia Vaccine 8+ Years old (1 of 1 - PCV) Never done   INFLUENZA VACCINE  Never done    Colorectal cancer screening: Type of screening: Colonoscopy. Completed 07/23/22. Repeat every 10 years  Lung Cancer Screening: (Low Dose CT Chest recommended if Age 33-80 years, 20 pack-year  currently smoking OR have quit w/in 15years.) does not qualify.   Lung Cancer Screening Referral: No  Additional Screening:  Hepatitis C Screening: does qualify; Completed will check for follow  Vision Screening: Recommended annual ophthalmology exams for early detection of glaucoma and other disorders of the eye. Is the patient up to date with their annual eye exam?  Yes  Who is the provider or what is the name of the office in which the patient attends annual eye exams? [facility] If pt is not established with a provider, would they like to be referred to a provider to establish care? No .   Dental Screening: Recommended annual dental exams for proper oral hygiene  Diabetic Foot Exam:  next visit  Community Resource Referral / Chronic Care Management: CRR required this visit?  No   CCM required this visit?  No     Plan:     I have personally reviewed and noted the following in the patient's chart:   Medical and social history Use of alcohol, tobacco or illicit drugs  Current medications and supplements including opioid prescriptions. Patient is not currently taking opioid prescriptions. Functional ability and status Nutritional status Physical activity Advanced directives List of other physicians Hospitalizations, surgeries, and ER visits in previous 12 months Vitals Screenings to include cognitive, depression, and falls Referrals and appointments  In addition, I have reviewed and discussed with patient certain preventive protocols, quality metrics, and best practice recommendations. A written personalized care plan for preventive services as well as general preventive health recommendations were provided to patient.     Yuktha Kerchner Medina-Vargas, NP   08/16/2023   After Visit Summary: (MyChart) Due to this being a telephonic visit, the after visit summary with patients personalized plan was offered to patient via MyChart   Nurse Notes:  Flu vaccine, Pneumonia  vaccine, Tdap, Zoster vaccine, hepatitis C antibody needed

## 2023-08-16 NOTE — Progress Notes (Signed)
 This service is provided via telemedicine  No vital signs collected/recorded due to the encounter was a telemedicine visit.   Location of patient (ex: home, work):  home  Patient consents to a telephone visit: yes  Location of the provider (ex: office, home):  [facility]   Name of any referring provider:  N/A  Names of all persons participating in the telemedicine service and their role in the encounter:  Danicia Terhaar/ RMA, Richarda Blade, NP, and Patient.   Time spent on call:  11

## 2023-08-18 ENCOUNTER — Encounter: Payer: Self-pay | Admitting: Urology

## 2023-08-18 NOTE — Patient Instructions (Signed)

## 2023-08-19 ENCOUNTER — Encounter: Payer: Self-pay | Admitting: Adult Health

## 2023-08-19 ENCOUNTER — Other Ambulatory Visit: Payer: Self-pay | Admitting: Adult Health

## 2023-08-19 DIAGNOSIS — M25552 Pain in left hip: Secondary | ICD-10-CM

## 2023-08-19 NOTE — Telephone Encounter (Signed)
Message routed to PCP Ngetich, Dinah C, NP  

## 2023-08-20 ENCOUNTER — Encounter: Payer: Self-pay | Admitting: Internal Medicine

## 2023-08-20 ENCOUNTER — Ambulatory Visit: Payer: 59 | Attending: Internal Medicine | Admitting: Internal Medicine

## 2023-08-20 VITALS — BP 104/60 | HR 71 | Ht 60.0 in | Wt 131.0 lb

## 2023-08-20 DIAGNOSIS — R0602 Shortness of breath: Secondary | ICD-10-CM | POA: Insufficient documentation

## 2023-08-20 DIAGNOSIS — R079 Chest pain, unspecified: Secondary | ICD-10-CM | POA: Diagnosis not present

## 2023-08-20 DIAGNOSIS — R0609 Other forms of dyspnea: Secondary | ICD-10-CM | POA: Diagnosis not present

## 2023-08-20 NOTE — Patient Instructions (Signed)
 Medication Instructions:  Your physician recommends that you continue on your current medications as directed. Please refer to the Current Medication list given to you today.  *If you need a refill on your cardiac medications before your next appointment, please call your pharmacy*  Lab Work: None If you have labs (blood work) drawn today and your tests are completely normal, you will receive your results only by: MyChart Message (if you have MyChart) OR A paper copy in the mail If you have any lab test that is abnormal or we need to change your treatment, we will call you to review the results.  Testing/Procedures: Your physician has requested that you have an echocardiogram. Echocardiography is a painless test that uses sound waves to create images of your heart. It provides your doctor with information about the size and shape of your heart and how well your heart's chambers and valves are working. This procedure takes approximately one hour. There are no restrictions for this procedure. Please do NOT wear cologne, perfume, aftershave, or lotions (deodorant is allowed). Please arrive 15 minutes prior to your appointment time.  Please note: We ask at that you not bring children with you during ultrasound (echo/ vascular) testing. Due to room size and safety concerns, children are not allowed in the ultrasound rooms during exams. Our front office staff cannot provide observation of children in our lobby area while testing is being conducted. An adult accompanying a patient to their appointment will only be allowed in the ultrasound room at the discretion of the ultrasound technician under special circumstances. We apologize for any inconvenience.  Your physician has requested that you have en exercise stress myoview. For further information please visit [email or web address] Please follow instruction sheet, as given.   Follow-Up: At [facility], you and your health needs are our  priority.  As part of our continuing mission to provide you with exceptional heart care, our providers are all part of one team.  This team includes your primary Cardiologist (physician) and Advanced Practice Providers or APPs (Physician Assistants and Nurse Practitioners) who all work together to provide you with the care you need, when you need it.  Your next appointment:   6 month(s)  Provider:   You may see Vishnu P Mallipeddi, MD or one of the following Advanced Practice Providers on your designated Care Team:   [place], PA-C  [place]ia [place], [place] Jacolyn Reedy, [place]     We recommend signing up for the patient portal called "MyChart".  Sign up information is provided on this After Visit Summary.  MyChart is used to connect with patients for Virtual Visits (Telemedicine).  Patients are able to view lab/test results, encounter notes, upcoming appointments, etc.  Non-urgent messages can be sent to your provider as well.   To learn more about what you can do with MyChart, go to [email or web address].   Other Instructions

## 2023-08-20 NOTE — Progress Notes (Signed)
 Cardiology Office Note  Date: 08/20/2023   ID: Michael Underwood, DOB [birth year], MRN [id number]  PCP:  Caesar Bookman, NP  Cardiologist:  Marjo Bicker, MD Electrophysiologist:  None   History of Present Illness: Michael Underwood is a 68 y.o. male with no PMH was referred to cardiology clinic for evaluation of chest pain.  Ongoing chest pain for the last 1 year.  Occurs with both rest and exertion, but mostly with exertion.  He noticed chest pain occurring when he walks uphill, doing any activity excessively.  Associated with SOB.  Frequency 2 times per week but unable to tell me the duration.  He said he does not pay much attention to this.  Physically active at baseline.  Denies having any other symptoms of leg swelling, syncope.  Dizziness occasionally, happens mainly from sitting to standing position.  He said that he gets palpitations only when he makes loud to his girlfriend.  He visits [place] often to see his girlfriend.  Past Surgical History:  Procedure Laterality Date   HERNIA REPAIR      Current Outpatient Medications  Medication Sig Dispense Refill   ibuprofen (ADVIL) 600 MG tablet Take 1 tablet (600 mg total) by mouth every 8 (eight) hours as needed. 30 tablet 0   alfuzosin (UROXATRAL) 10 MG 24 hr tablet Take 1 tablet (10 mg total) by mouth at bedtime. (Patient not taking: Reported on 08/20/2023) 30 tablet 11   aspirin EC 81 MG tablet Take 1 tablet (81 mg total) by mouth daily. Swallow whole. (Patient not taking: Reported on 08/20/2023)     tadalafil (CIALIS) 5 MG tablet Take 1 tablet (5 mg total) by mouth daily as needed for erectile dysfunction. (Patient not taking: Reported on 08/20/2023) 30 tablet 0   tamsulosin (FLOMAX) 0.4 MG CAPS capsule Take 0.4 mg by mouth as needed. (Patient not taking: Reported on 08/20/2023)     No current facility-administered medications for this visit.   Allergies:  Patient has no known allergies.   Social History: The patient  reports that he  has quit smoking. His smoking use included cigarettes. He has never used smokeless tobacco. He reports current alcohol use of about 2.0 standard drinks of alcohol per week. He reports that he does not use drugs.   Family History: The patient's family history is not on file.   ROS:  Please see the history of present illness. Otherwise, complete review of systems is positive for none.  All other systems are reviewed and negative.   Physical Exam: VS:  BP 104/60 (BP Location: Left Arm, Patient Position: Sitting, Cuff Size: Normal)   Pulse 71   Ht 5' (1.524 m)   Wt 131 lb (59.4 kg)   SpO2 96%   BMI 25.58 kg/m , BMI Body mass index is 25.58 kg/m.  Wt Readings from Last 3 Encounters:  08/20/23 131 lb (59.4 kg)  04/23/23 138 lb 6.4 oz (62.8 kg)  01/16/23 135 lb 3.2 oz (61.3 kg)    General: Patient appears comfortable at rest. HEENT: Conjunctiva and lids normal, oropharynx clear with moist mucosa. Neck: Supple, no elevated JVP or carotid bruits, no thyromegaly. Lungs: Clear to auscultation, nonlabored breathing at rest. Cardiac: Regular rate and rhythm, no S3 or significant systolic murmur, no pericardial rub. Abdomen: Soft, nontender, no hepatomegaly, bowel sounds present, no guarding or rebound. Extremities: No pitting edema, distal pulses 2+. Skin: Warm and dry. Musculoskeletal: No kyphosis. Neuropsychiatric: Alert and oriented x3, affect grossly appropriate.  Recent Labwork:  04/24/2023: ALT 13; AST 12; BUN 20; Creat 0.90; Hemoglobin 15.9; Platelets 296; Potassium 4.7; Sodium 136     Component Value Date/Time   CHOL 131 04/24/2022 0907   TRIG 94 04/24/2022 0907   HDL 30 (L) 04/24/2022 0907   CHOLHDL 4.4 04/24/2022 0907   LDLCALC 82 04/24/2022 0907     Assessment and Plan:  Possibly cardiac chest pain: He has chest pain both at rest and exertion but mostly with exertion.  Exertional activities include walking uphill, doing any activity excessively.  Unable to recall the  duration but frequency has been 2 times per week.  Ongoing for the last 1 year.  Associated with SOB.  Will obtain exercise Myoview and echocardiogram.    Medication Adjustments/Labs and Tests Ordered: Current medicines are reviewed at length with the patient today.  Concerns regarding medicines are outlined above.    Disposition:  Follow up 6 months  Signed, Ronda Rajkumar Verne Spurr, MD, 08/20/2023 3:52 PM    [facility] [address] [place], [place] [address]

## 2023-08-22 ENCOUNTER — Ambulatory Visit (HOSPITAL_COMMUNITY)
Admission: RE | Admit: 2023-08-22 | Discharge: 2023-08-22 | Disposition: A | Discharge status: Home / Self Care | Source: Ambulatory Visit | Attending: Adult Health | Admitting: Adult Health

## 2023-08-22 DIAGNOSIS — M25552 Pain in left hip: Secondary | ICD-10-CM | POA: Insufficient documentation

## 2023-08-31 DIAGNOSIS — T148XXA Other injury of unspecified body region, initial encounter: Secondary | ICD-10-CM | POA: Diagnosis not present

## 2023-09-01 NOTE — Progress Notes (Signed)
-   hip x-ray is negative for any abnormality.

## 2023-09-02 ENCOUNTER — Ambulatory Visit
Admission: RE | Admit: 2023-09-02 | Discharge: 2023-09-02 | Disposition: A | Discharge status: Home / Self Care | Source: Ambulatory Visit

## 2023-09-02 ENCOUNTER — Ambulatory Visit

## 2023-09-02 VITALS — BP 114/71 | HR 79 | Temp 97.9°F | Resp 16

## 2023-09-02 DIAGNOSIS — L089 Local infection of the skin and subcutaneous tissue, unspecified: Secondary | ICD-10-CM | POA: Diagnosis not present

## 2023-09-02 DIAGNOSIS — T148XXA Other injury of unspecified body region, initial encounter: Secondary | ICD-10-CM | POA: Diagnosis not present

## 2023-09-02 MED ORDER — CHLORHEXIDINE GLUCONATE 4 % EX SOLN: Freq: Every day | CUTANEOUS | 0 refills | Status: DC | PRN

## 2023-09-02 MED ORDER — MUPIROCIN 2 % EX OINT: 1.0000 | TOPICAL_OINTMENT | Freq: Two times a day (BID) | CUTANEOUS | 0 refills | Status: DC

## 2023-09-02 NOTE — ED Triage Notes (Signed)
 Pt presents with quarter size wound/ blister on left calf x 5 days. Went to another Urgent care and got prescribed antibiotics on Saturday but has not seen any improvement.

## 2023-09-02 NOTE — Discharge Instructions (Signed)
 Clean the area at least once a day with the Hibiclens solution and apply the mupirocin ointment and a nonstick gauze pad.  Wrap the area and Coban wrap to keep the dressing in place.  Elevate the leg at rest to help with swelling.  Continue the course of antibiotics until complete and follow-up if worsening or not resolving

## 2023-09-02 NOTE — Progress Notes (Signed)
-    Left hip is negative for any abnormality.  If he is till having pain, then we need to re-assess him. Pls follow up in clinic for further assessment.

## 2023-09-02 NOTE — ED Provider Notes (Signed)
 [facility]    CSN: [id number] Arrival date & time: 09/02/23  1239      History   Chief Complaint Chief Complaint  Patient presents with   Wound Check    Blister/ leg infection in left leg that has gotten worse. - Entered by patient    HPI Michael Underwood is a 68 y.o. male.   Patient presenting today with a blister to the left lower leg that he first noticed about 5 days ago.  He states the area had become more painful, red and having thick purulent drainage so he went to a different urgent care over the weekend and was given a course of Keflex.  Started the medication Saturday evening but notes no significant improvement in symptoms.  Denies fever, nausea, leg numbness or tingling, decreased range of motion.  States he is using alcohol and Vicks vapor rub topically.    History reviewed. No pertinent past medical history.  Patient Active Problem List   Diagnosis Date Noted   Cardiac chest pain 08/20/2023   SOB (shortness of breath) 08/20/2023    Past Surgical History:  Procedure Laterality Date   HERNIA REPAIR         Home Medications    Prior to Admission medications   Medication Sig Start Date End Date Taking? Authorizing Provider  alfuzosin (UROXATRAL) 10 MG 24 hr tablet Take 1 tablet (10 mg total) by mouth at bedtime. 08/14/23  Yes McKenzie, Arden Beck, MD  cephALEXin (KEFLEX) 500 MG capsule Take 500 mg by mouth 2 (two) times daily. 08/31/23  Yes [provider]  chlorhexidine (HIBICLENS) 4 % external liquid Apply topically daily as needed. 09/02/23  Yes Corbin Dess, PA-C  mupirocin ointment (BACTROBAN) 2 % Apply 1 Application topically 2 (two) times daily. 09/02/23  Yes Corbin Dess, PA-C  aspirin EC 81 MG tablet Take 1 tablet (81 mg total) by mouth daily. Swallow whole. Patient not taking: Reported on 08/20/2023 04/25/23   Ngetich, Dinah C, NP  ibuprofen (ADVIL) 600 MG tablet Take 1 tablet (600 mg total) by mouth every 8 (eight)  hours as needed. 01/16/23   Ngetich, Dinah C, NP  tadalafil (CIALIS) 5 MG tablet Take 1 tablet (5 mg total) by mouth daily as needed for erectile dysfunction. Patient not taking: Reported on 08/20/2023 08/14/23   Marco Severs, MD  tamsulosin (FLOMAX) 0.4 MG CAPS capsule Take 0.4 mg by mouth as needed. Patient not taking: Reported on 08/20/2023    [provider]    Family History Family History  Problem Relation Age of Onset   Colon cancer Neg Hx    Colon polyps Neg Hx    Esophageal cancer Neg Hx    Rectal cancer Neg Hx    Stomach cancer Neg Hx     Social History Social History   Tobacco Use   Smoking status: Former    Types: Cigarettes   Smokeless tobacco: Never  Vaping Use   Vaping status: Never Used  Substance Use Topics   Alcohol use: Yes    Alcohol/week: 2.0 standard drinks of alcohol    Types: 2 Cans of beer per week    Comment: socially   Drug use: Never     Allergies   Patient has no known allergies.   Review of Systems Review of Systems PER HPI  Physical Exam Triage Vital Signs ED Triage Vitals  Encounter Vitals Group     BP 09/02/23 1324 114/71     Systolic  BP Percentile --      Diastolic BP Percentile --      Pulse Rate 09/02/23 1324 79     Resp 09/02/23 1324 16     Temp 09/02/23 1324 97.9 F (36.6 C)     Temp Source 09/02/23 1324 Oral     SpO2 09/02/23 1324 94 %     Weight --      Height --      Head Circumference --      Peak Flow --      Pain Score 09/02/23 1327 10     Pain Loc --      Pain Education --      Exclude from Growth Chart --    No data found.  Updated Vital Signs BP 114/71 (BP Location: Right Arm)   Pulse 79   Temp 97.9 F (36.6 C) (Oral)   Resp 16   SpO2 94%   Visual Acuity Right Eye Distance:   Left Eye Distance:   Bilateral Distance:    Right Eye Near:   Left Eye Near:    Bilateral Near:     Physical Exam Vitals and nursing note reviewed.  Constitutional:      Appearance: Normal appearance.   HENT:     Head: Atraumatic.  Eyes:     Extraocular Movements: Extraocular movements intact.     Conjunctiva/sclera: Conjunctivae normal.  Cardiovascular:     Rate and Rhythm: Normal rate and regular rhythm.  Pulmonary:     Effort: Pulmonary effort is normal.     Breath sounds: Normal breath sounds.  Musculoskeletal:        General: Normal range of motion.     Cervical back: Normal range of motion and neck supple.  Skin:    General: Skin is warm.     Findings: Erythema present.     Comments: 2 cm diameter circular blister present to left lower leg laterally.  Surrounding erythema to the entire wound base.  Area actively draining, with pressure applied was able to express copious amounts of clear and purulent mixed discharge.  Neurological:     General: No focal deficit present.     Mental Status: He is oriented to person, place, and time.     Motor: No weakness.     Gait: Gait normal.     Comments: Left lower extremity neurovascularly intact  Psychiatric:        Mood and Affect: Mood normal.        Thought Content: Thought content normal.        Judgment: Judgment normal.      UC Treatments / Results  Labs (all labs ordered are listed, but only abnormal results are displayed) Labs Reviewed - No data to display  EKG   Radiology No results found.  Procedures Procedures (including critical care time)  Medications Ordered in UC Medications - No data to display  Initial Impression / Assessment and Plan / UC Course  I have reviewed the triage vital signs and the nursing notes.  Pertinent labs & imaging results that were available during my care of the patient were reviewed by me and considered in my medical decision making (see chart for details).     Will treat with continuing course of Keflex prescribed a day and a half ago, change topical wound care products to Hibiclens and mupirocin and discussed elevating leg at rest as well as keeping the area covered.   Follow-up for worsening or unresolving symptoms.  Final Clinical Impressions(s) / UC Diagnoses   Final diagnoses:  Infected blister     Discharge Instructions      Clean the area at least once a day with the Hibiclens solution and apply the mupirocin ointment and a nonstick gauze pad.  Wrap the area and Coban wrap to keep the dressing in place.  Elevate the leg at rest to help with swelling.  Continue the course of antibiotics until complete and follow-up if worsening or not resolving    ED Prescriptions     Medication Sig Dispense Auth. Provider   chlorhexidine (HIBICLENS) 4 % external liquid Apply topically daily as needed. 236 mL Corbin Dess, PA-C   mupirocin ointment (BACTROBAN) 2 % Apply 1 Application topically 2 (two) times daily. 22 g Corbin Dess, [place]      PDMP not reviewed this encounter.   Corbin Dess, [place] 09/02/23 1413

## 2023-09-04 ENCOUNTER — Ambulatory Visit (HOSPITAL_BASED_OUTPATIENT_CLINIC_OR_DEPARTMENT_OTHER)
Admission: RE | Admit: 2023-09-04 | Discharge: 2023-09-04 | Disposition: A | Discharge status: Home / Self Care | Source: Ambulatory Visit | Attending: Internal Medicine | Admitting: Internal Medicine

## 2023-09-04 ENCOUNTER — Ambulatory Visit (HOSPITAL_COMMUNITY)
Admission: RE | Admit: 2023-09-04 | Discharge: 2023-09-04 | Disposition: A | Discharge status: Home / Self Care | Source: Ambulatory Visit | Attending: Internal Medicine | Admitting: Internal Medicine

## 2023-09-04 ENCOUNTER — Encounter (HOSPITAL_COMMUNITY): Payer: Self-pay

## 2023-09-04 ENCOUNTER — Ambulatory Visit (HOSPITAL_BASED_OUTPATIENT_CLINIC_OR_DEPARTMENT_OTHER)
Admission: RE | Admit: 2023-09-04 | Discharge: 2023-09-04 | Disposition: A | Discharge status: Home / Self Care | Source: Ambulatory Visit | Attending: Internal Medicine

## 2023-09-04 DIAGNOSIS — R079 Chest pain, unspecified: Secondary | ICD-10-CM

## 2023-09-04 DIAGNOSIS — R0609 Other forms of dyspnea: Secondary | ICD-10-CM

## 2023-09-04 LAB — NM MYOCAR MULTI W/SPECT W/WALL MOTION / EF
Base ST Depression (mm): 0 mm
Estimated workload: 1
Exercise duration (min): 0 min
Exercise duration (sec): 0 s
LV dias vol: 69 mL (ref 62–150)
LV sys vol: 23 mL
MPHR: 151 {beats}/min
Nuc Stress EF: 67 %
Peak HR: 87 {beats}/min
Percent HR: 57 %
RATE: 0.3
RPE: 1
Rest HR: 63 {beats}/min
Rest Nuclear Isotope Dose: 10.2 mCi
SDS: 2
SRS: 0
SSS: 2
ST Depression (mm): 0 mm
Stress Nuclear Isotope Dose: 32 mCi
TID: 1.05

## 2023-09-04 LAB — ECHOCARDIOGRAM COMPLETE
AR max vel: 2.84 cm2
AV Area VTI: 3.07 cm2
AV Area mean vel: 3.13 cm2
AV Mean grad: 3 mmHg
AV Peak grad: 7.2 mmHg
Ao pk vel: 1.34 m/s
Area-P 1/2: 2.95 cm2
S' Lateral: 1.8 cm

## 2023-09-04 MED ORDER — REGADENOSON 0.4 MG/5ML IV SOLN
INTRAVENOUS | Status: AC
  Administered 2023-09-04: 0.4 mg via INTRAVENOUS
  Filled 2023-09-04: qty 5

## 2023-09-04 MED ORDER — SODIUM CHLORIDE FLUSH 0.9 % IV SOLN
INTRAVENOUS | Status: AC
  Administered 2023-09-04: 10 mL via INTRAVENOUS
  Filled 2023-09-04: qty 10

## 2023-09-04 MED ORDER — TECHNETIUM TC 99M TETROFOSMIN IV KIT
30.0000 | PACK | Freq: Once | INTRAVENOUS | Status: AC | PRN
  Administered 2023-09-04: 32 via INTRAVENOUS

## 2023-09-04 MED ORDER — TECHNETIUM TC 99M TETROFOSMIN IV KIT
10.0000 | PACK | Freq: Once | INTRAVENOUS | Status: AC | PRN
  Administered 2023-09-04: 10.2 via INTRAVENOUS

## 2023-09-04 NOTE — Progress Notes (Signed)
*  PRELIMINARY RESULTS* Echocardiogram 2D Echocardiogram has been performed.  Bernis Brisker 09/04/2023, 12:47 PM

## 2023-09-06 ENCOUNTER — Ambulatory Visit (HOSPITAL_COMMUNITY)

## 2023-09-09 ENCOUNTER — Encounter: Payer: Self-pay | Admitting: Internal Medicine

## 2023-09-09 ENCOUNTER — Telehealth: Payer: Self-pay

## 2023-09-09 DIAGNOSIS — I77810 Thoracic aortic ectasia: Secondary | ICD-10-CM

## 2023-09-09 NOTE — Telephone Encounter (Signed)
-----   Message from Michael Underwood sent at 09/06/2023  1:56 PM EDT ----- Normal Echo. 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 Although 39 is little above the normal range of 38 mm, would obtain CTA chest/aorta in 6 months before the next clinic visit given his height of 5 feet.

## 2023-09-09 NOTE — Telephone Encounter (Signed)
 The patient has been notified of the result and verbalized understanding.  All questions (if any) were answered. Casper Clement, CMA 09/09/2023 2:00 PM

## 2023-09-11 ENCOUNTER — Encounter: Admitting: Family

## 2023-09-15 NOTE — Progress Notes (Signed)
   This encounter was created in error - please disregard. No show

## 2023-09-17 ENCOUNTER — Other Ambulatory Visit: Payer: Self-pay | Admitting: Family

## 2023-09-17 MED ORDER — TAMSULOSIN HCL 0.4 MG PO CAPS: 0.4000 mg | ORAL_CAPSULE | ORAL | 1 refills | Status: AC | PRN

## 2023-09-17 MED ORDER — TADALAFIL 5 MG PO TABS: 5.0000 mg | ORAL_TABLET | Freq: Every day | ORAL | 3 refills | Status: DC | PRN

## 2023-09-17 NOTE — Telephone Encounter (Signed)
 Copied from CRM [id number]. Topic: Clinical - Medication Refill >> Sep 17, 2023  9:23 AM Madelyne Schiff wrote: Most Recent Primary Care Visit:  Provider: Duncan Gibson  Department: [facility]  Visit Type: ANNUAL WELL VISIT, SEQUENTIAL  Date: 08/16/2023  Medication: tadalafil  (CIALIS ) 5 MG tablet tamsulosin  (FLOMAX ) 0.4 MG CAPS capsule   Has the patient contacted their pharmacy? No (Agent: If no, request that the patient contact the pharmacy for the refill. If patient does not wish to contact the pharmacy document the reason why and proceed with request.) (Agent: If yes, when and what did the pharmacy advise?)  Is this the correct pharmacy for this prescription? Yes If no, delete pharmacy and type the correct one.  This is the patient's preferred pharmacy:  [id number] - [place], [place] - [address] [place] [address] Phone: [phone] Fax: [phone]    Is the patient out of the medication? Yes  Has the patient been seen for an appointment in the last year OR does the patient have an upcoming appointment? Yes  Can we respond through MyChart? No  Agent: Please be advised that Rx refills may take up to 3 business days. We ask that you follow-up with your pharmacy.

## 2023-09-17 NOTE — ED Triage Notes (Signed)
 Pt reports to UC for refill of Tamsulosin .

## 2023-09-18 ENCOUNTER — Ambulatory Visit
Admission: EM | Admit: 2023-09-18 | Discharge: 2023-09-18 | Disposition: A | Discharge status: Home / Self Care | Attending: Nurse Practitioner | Admitting: Nurse Practitioner

## 2023-09-18 DIAGNOSIS — M940 Chondrocostal junction syndrome [Tietze]: Secondary | ICD-10-CM | POA: Diagnosis not present

## 2023-09-18 LAB — POCT URINALYSIS DIP (MANUAL ENTRY)
Bilirubin, UA: NEGATIVE
Blood, UA: NEGATIVE
Glucose, UA: NEGATIVE mg/dL
Ketones, POC UA: NEGATIVE mg/dL
Leukocytes, UA: NEGATIVE
Nitrite, UA: NEGATIVE
Protein Ur, POC: NEGATIVE mg/dL
Spec Grav, UA: 1.025 (ref 1.010–1.025)
Urobilinogen, UA: 0.2 U/dL
pH, UA: 5.5 (ref 5.0–8.0)

## 2023-09-18 NOTE — Discharge Instructions (Addendum)
 The pain you are having is at the bottom of your rib cage, therefore you may have a pulled muscle or inflamed muscle tissue.  You can take Tylenol  500 to 1000 mg, apply muscle rubs, and take an over-the-counter NSAID like aspirin  or ibuprofen  sparingly as needed to help with pain.  Recommend close follow-up with primary care provider if symptoms do not improve with treatment or if symptoms worsen.  Urine sample today looks great without signs of kidney stone or bladder infection.

## 2023-09-18 NOTE — ED Triage Notes (Signed)
 Pt reports wanting a refill for cephalexin for pain in his side, kidney stone. Nurse has explained that the medication he is requesting is an antibiotic. Pt is wanting something to help with pain for the kidney stone.

## 2023-09-18 NOTE — ED Provider Notes (Signed)
 [facility]    CSN: [id number] Arrival date & time: 09/18/23  0913      History   Chief Complaint No chief complaint on file.   HPI Michael Underwood is a 68 y.o. male.   Patient presents to the with longstanding "side pain."  Reports the pain is sharp and comes and goes quickly.  Has been ongoing for the past year or more.  Pain is worse with certain movements or when he lift something heavy.  He is going to [place] soon for a visit and wants to make sure he has enough medicine.  He has taken Tylenol  which does help with the pain.  Reports he was treated for similar a few years ago with a prescription of cephalexin and reports he takes this as needed and helps with his symptoms.  He then later states he is not sure that this is the right medicine he is talking about as he has a pill bottle with him today.  Reports he has had kidney stone in the past.  He denies any new burning with urination, new increased urinary frequency, urinary urgency, hematuria, abdominal pain, flank pain, fever, nausea/vomiting, and penile discharge.  Patient reports history of urinary frequency worse at nighttime and erectile dysfunction and follows closely with urology.    History reviewed. No pertinent past medical history.  Patient Active Problem List   Diagnosis Date Noted   Cardiac chest pain 08/20/2023   SOB (shortness of breath) 08/20/2023    Past Surgical History:  Procedure Laterality Date   HERNIA REPAIR         Home Medications    Prior to Admission medications   Medication Sig Start Date End Date Taking? Authorizing Provider  alfuzosin  (UROXATRAL ) 10 MG 24 hr tablet Take 1 tablet (10 mg total) by mouth at bedtime. 08/14/23   McKenzie, Arden Beck, MD  aspirin  EC 81 MG tablet Take 1 tablet (81 mg total) by mouth daily. Swallow whole. Patient not taking: Reported on 08/20/2023 04/25/23   Ngetich, Dinah C, NP  chlorhexidine  (HIBICLENS ) 4 % external liquid Apply topically daily as  needed. 09/02/23   Corbin Dess, PA-C  ibuprofen  (ADVIL ) 600 MG tablet Take 1 tablet (600 mg total) by mouth every 8 (eight) hours as needed. 01/16/23   Ngetich, Dinah C, NP  mupirocin  ointment (BACTROBAN ) 2 % Apply 1 Application topically 2 (two) times daily. 09/02/23   Corbin Dess, PA-C  tadalafil  (CIALIS ) 5 MG tablet Take 1 tablet (5 mg total) by mouth daily as needed for erectile dysfunction. 09/17/23   Ngetich, Dinah C, NP  tamsulosin  (FLOMAX ) 0.4 MG CAPS capsule Take 1 capsule (0.4 mg total) by mouth as needed. 09/17/23 12/16/23  Ngetich, Elijio [place], NP    Family History Family History  Problem Relation Age of Onset   Colon cancer Neg Hx    Colon polyps Neg Hx    Esophageal cancer Neg Hx    Rectal cancer Neg Hx    Stomach cancer Neg Hx     Social History Social History   Tobacco Use   Smoking status: Former    Types: Cigarettes   Smokeless tobacco: Never  Vaping Use   Vaping status: Never Used  Substance Use Topics   Alcohol use: Yes    Alcohol/week: 2.0 standard drinks of alcohol    Types: 2 Cans of beer per week    Comment: socially   Drug use: Never     Allergies   Patient  has no known allergies.   Review of Systems Review of Systems Per HPI  Physical Exam Triage Vital Signs ED Triage Vitals  Encounter Vitals Group     BP 09/18/23 0928 113/74     Systolic BP Percentile --      Diastolic BP Percentile --      Pulse Rate 09/18/23 0928 67     Resp 09/18/23 0928 18     Temp 09/18/23 0928 97.7 F (36.5 C)     Temp Source 09/18/23 0928 Oral     SpO2 09/18/23 0928 95 %     Weight --      Height --      Head Circumference --      Peak Flow --      Pain Score 09/18/23 0931 3     Pain Loc --      Pain Education --      Exclude from Growth Chart --    No data found.  Updated Vital Signs BP 113/74 (BP Location: Right Arm)   Pulse 67   Temp 97.7 F (36.5 C) (Oral)   Resp 18   SpO2 95%   Visual Acuity Right Eye Distance:   Left Eye  Distance:   Bilateral Distance:    Right Eye Near:   Left Eye Near:    Bilateral Near:     Physical Exam Vitals and nursing note reviewed.  Constitutional:      General: He is not in acute distress.    Appearance: Normal appearance. He is not toxic-appearing.  HENT:     Mouth/Throat:     Mouth: Mucous membranes are moist.     Pharynx: Oropharynx is clear.  Cardiovascular:     Rate and Rhythm: Normal rate and regular rhythm.  Pulmonary:     Effort: Pulmonary effort is normal. No respiratory distress.     Breath sounds: Normal breath sounds. No wheezing, rhonchi or rales.  Abdominal:     General: Abdomen is flat. Bowel sounds are normal. There is no distension.     Palpations: Abdomen is soft.     Tenderness: There is no abdominal tenderness. There is no right CVA tenderness, left CVA tenderness or guarding.       Comments: Tenderness to palpation of rib cage in area marked.  No swelling, bruising, obvious deformity, redness.  No CVA tenderness.  Skin:    General: Skin is warm and dry.     Coloration: Skin is not jaundiced or pale.     Findings: No erythema.  Neurological:     Mental Status: He is alert and oriented to person, place, and time.  Psychiatric:        Behavior: Behavior is cooperative.      UC Treatments / Results  Labs (all labs ordered are listed, but only abnormal results are displayed) Labs Reviewed  POCT URINALYSIS DIP (MANUAL ENTRY)    EKG   Radiology No results found.  Procedures Procedures (including critical care time)  Medications Ordered in UC Medications - No data to display  Initial Impression / Assessment and Plan / UC Course  I have reviewed the triage vital signs and the nursing notes.  Pertinent labs & imaging results that were available during my care of the patient were reviewed by me and considered in my medical decision making (see chart for details).   Patient is well-appearing, normotensive, afebrile, not tachycardic,  not tachypneic, oxygenating well on room air.    1. Costochondritis Suspect  costochondritis Recommended over-the-counter Tylenol , sparing use of aspirin  or ibuprofen  as needed for pain as well as warm/cool compresses and muscle rubs Urinalysis is negative today for signs of kidney stone and no CVA tenderness; we discussed a very low suspicion for kidney etiology at this time  The patient was given the opportunity to ask questions.  All questions answered to their satisfaction.  The patient is in agreement to this plan.   Final Clinical Impressions(s) / UC Diagnoses   Final diagnoses:  Costochondritis     Discharge Instructions      The pain you are having is at the bottom of your rib cage, therefore you may have a pulled muscle or inflamed muscle tissue.  You can take Tylenol  500 to 1000 mg, apply muscle rubs, and take an over-the-counter NSAID like aspirin  or ibuprofen  sparingly as needed to help with pain.  Recommend close follow-up with primary care provider if symptoms do not improve with treatment or if symptoms worsen.  Urine sample today looks great without signs of kidney stone or bladder infection.     ED Prescriptions   None    PDMP not reviewed this encounter.   Wilhemena Harbour, NP 09/18/23 [phone]

## 2023-10-10 ENCOUNTER — Telehealth: Admitting: Physician Assistant

## 2023-10-10 DIAGNOSIS — K047 Periapical abscess without sinus: Secondary | ICD-10-CM

## 2023-10-11 MED ORDER — AMOXICILLIN-POT CLAVULANATE 875-125 MG PO TABS
1.0000 | ORAL_TABLET | Freq: Two times a day (BID) | ORAL | 0 refills | Status: DC
Start: 2023-10-11 — End: 2024-01-14

## 2023-10-11 NOTE — Progress Notes (Signed)

## 2023-10-15 DIAGNOSIS — K047 Periapical abscess without sinus: Secondary | ICD-10-CM | POA: Diagnosis not present

## 2023-10-23 ENCOUNTER — Ambulatory Visit: Payer: 59 | Admitting: Family

## 2023-12-04 ENCOUNTER — Ambulatory Visit: Admitting: Urology

## 2024-01-14 ENCOUNTER — Encounter: Payer: Self-pay | Admitting: Family

## 2024-01-14 ENCOUNTER — Ambulatory Visit: Admitting: Family

## 2024-01-14 VITALS — BP 116/74 | HR 61 | Temp 97.6°F | Resp 19 | Ht 60.0 in | Wt 136.6 lb

## 2024-01-14 DIAGNOSIS — I10 Essential (primary) hypertension: Secondary | ICD-10-CM | POA: Diagnosis not present

## 2024-01-14 DIAGNOSIS — H538 Other visual disturbances: Secondary | ICD-10-CM

## 2024-01-14 DIAGNOSIS — M7918 Myalgia, other site: Secondary | ICD-10-CM

## 2024-01-14 DIAGNOSIS — Z13220 Encounter for screening for lipoid disorders: Secondary | ICD-10-CM | POA: Diagnosis not present

## 2024-01-14 DIAGNOSIS — F1090 Alcohol use, unspecified, uncomplicated: Secondary | ICD-10-CM

## 2024-01-14 DIAGNOSIS — Z23 Encounter for immunization: Secondary | ICD-10-CM | POA: Diagnosis not present

## 2024-01-14 DIAGNOSIS — K219 Gastro-esophageal reflux disease without esophagitis: Secondary | ICD-10-CM | POA: Diagnosis not present

## 2024-01-14 DIAGNOSIS — R351 Nocturia: Secondary | ICD-10-CM

## 2024-01-14 LAB — COMPREHENSIVE METABOLIC PANEL WITH GFR
AG Ratio: 1.1 (calc) (ref 1.0–2.5)
ALT: 20 U/L (ref 9–46)
AST: 16 U/L (ref 10–35)
Albumin: 3.4 g/dL — ABNORMAL LOW (ref 3.6–5.1)
Alkaline phosphatase (APISO): 168 U/L — ABNORMAL HIGH (ref 35–144)
BUN: 19 mg/dL (ref 7–25)
CO2: 25 mmol/L (ref 20–32)
Calcium: 10.5 mg/dL — ABNORMAL HIGH (ref 8.6–10.3)
Chloride: 108 mmol/L (ref 98–110)
Creat: 0.84 mg/dL (ref 0.70–1.28)
Globulin: 3.1 g/dL (ref 1.9–3.7)
Glucose, Bld: 94 mg/dL (ref 65–99)
Potassium: 4.3 mmol/L (ref 3.5–5.3)
Sodium: 138 mmol/L (ref 135–146)
Total Bilirubin: 0.4 mg/dL (ref 0.2–1.2)
Total Protein: 6.5 g/dL (ref 6.1–8.1)
eGFR: 94 mL/min/1.73m2 (ref >=60)

## 2024-01-14 LAB — CBC WITH DIFFERENTIAL/PLATELET
Absolute Lymphocytes: 1916 {cells}/uL (ref 850–3900)
Absolute Monocytes: 598 {cells}/uL (ref 200–950)
Basophils Absolute: 39 {cells}/uL (ref 0–200)
Basophils Relative: 0.8 %
Eosinophils Absolute: 221 {cells}/uL (ref 15–500)
Eosinophils Relative: 4.5 %
HCT: 44.4 % (ref 38.5–50.0)
Hemoglobin: 14.5 g/dL (ref 13.2–17.1)
MCH: 28.9 pg (ref 27.0–33.0)
MCHC: 32.7 g/dL (ref 32.0–36.0)
MCV: 88.4 fL (ref 80.0–100.0)
MPV: 11 fL (ref 7.5–12.5)
Monocytes Relative: 12.2 %
Neutro Abs: 2127 {cells}/uL (ref 1500–7800)
Neutrophils Relative %: 43.4 %
Platelets: 280 Thousand/uL (ref 140–400)
RBC: 5.02 Million/uL (ref 4.20–5.80)
RDW: 12.5 % (ref 11.0–15.0)
Total Lymphocyte: 39.1 %
WBC: 4.9 Thousand/uL (ref 3.8–10.8)

## 2024-01-14 LAB — LIPID PANEL
Cholesterol: 130 mg/dL (ref <200)
HDL: 34 mg/dL — ABNORMAL LOW (ref >=40)
LDL Cholesterol (Calc): 79 mg/dL
Non-HDL Cholesterol (Calc): 96 mg/dL (ref <130)
Total CHOL/HDL Ratio: 3.8 (calc) (ref <5.0)
Triglycerides: 83 mg/dL (ref <150)

## 2024-01-14 NOTE — Patient Instructions (Signed)
 1.Vaccine recommendations Covid,shingles and pneumonia can receive at local pharmacy.

## 2024-01-19 DIAGNOSIS — K219 Gastro-esophageal reflux disease without esophagitis: Secondary | ICD-10-CM | POA: Insufficient documentation

## 2024-01-19 DIAGNOSIS — I10 Essential (primary) hypertension: Secondary | ICD-10-CM | POA: Insufficient documentation

## 2024-01-19 NOTE — Progress Notes (Signed)
 Provider: Roxan Plough FNP-C   Uvaldo Rybacki, Roxan BROCKS, NP  Patient Care Team: Dajuan Turnley, Roxan BROCKS, NP as PCP - General (Family Medicine) Stacia Diannah SQUIBB, MD as PCP - Cardiology (Cardiology)  Extended Emergency Contact Information Primary Emergency Contact: Harbison,Rebecca Address: [address]          [place], [place] [address] [place] Home Phone: [phone] Mobile Phone: [phone] Relation: Daughter Secondary Emergency Contact: [facility] Address: [address]          [place], [place] [address] [place] Home Phone: [phone] Mobile Phone: [phone] Relation: Daughter  Code Status:  Full Code  Goals of care: Advanced Directive information    01/14/2024    9:42 AM  Advanced Directives  Does Patient Have a Medical Advance Directive? No  Would patient like information on creating a medical advance directive? No - Patient declined     Chief Complaint  Patient presents with   Medical Management of Chronic Issues    6 Month follow up.    HPI:  Pt is a 68 y.o. male seen today for medical management of chronic diseases.   Discussed the use of AI scribe software for clinical note transcription with the patient, who gave verbal consent to proceed.  History of Present Illness   Nicholi Ghuman is a 68 year old male who presents for a six-month follow-up visit.  He experiences occasional blurry vision and has not yet consulted an eye doctor. He also has intermittent itching and redness in his eyes, which do not correlate with weather changes. No runny nose is present.  He monitors his blood pressure at home and believes it is well-controlled. No headaches or dizziness recently. He feels tired at times and occasionally has red eyes, despite getting enough rest at night.  He has a history of smoking but has quit. He currently consumes about two beers every other day.  He has a history of testicular pain, which occurs only when lifting heavy objects.  He  has a family history of prostate cancer; his father had prostate cancer ten years before passing away.  He experiences occasional pain in his shoulders and arms, particularly when lifting heavy objects. He uses an anti-inflammatory medication, 'Indarzon', as needed for muscular pain.  He uses 'Acido Acetilsalicilico', obtained from [place], for chest pain. He experiences chest pain occasionally, especially when walking uphill, and finds relief with this medication, which he now understands is aspirin .  No issues with urination but wakes up about four times a night to urinate. He reports drinking water late at night.  He experiences occasional tightness in his thigh muscles and fatigue in his feet after prolonged walking or standing. He attributes knee pain to long walks and acknowledges the need for supportive footwear and regular exercise.   History reviewed. No pertinent past medical history. Past Surgical History:  Procedure Laterality Date   HERNIA REPAIR      No Known Allergies  Allergies as of 01/14/2024   No Known Allergies      Medication List        Accurate as of January 14, 2024 11:59 PM. If you have any questions, ask your nurse or doctor.          STOP taking these medications    amoxicillin -clavulanate 875-125 MG tablet Commonly known as: AUGMENTIN  Stopped by: Fabyan Loughmiller C Pharrah Rottman   chlorhexidine  4 % external liquid Commonly known as: Hibiclens  Stopped by: Roxan BROCKS Jerolene Kupfer   ibuprofen  600 MG tablet  Commonly known as: ADVIL  Stopped by: Duwan Adrian C Brandice Busser   mupirocin  ointment 2 % Commonly known as: BACTROBAN  Stopped by: Jena Tegeler C Daulton Harbaugh       TAKE these medications    alfuzosin  10 MG 24 hr tablet Commonly known as: UROXATRAL  Take 1 tablet (10 mg total) by mouth at bedtime.   aspirin  EC 81 MG tablet Take 1 tablet (81 mg total) by mouth daily. Swallow whole.   predniSONE  20 MG tablet Commonly known as: DELTASONE  Take 40 mg by mouth daily.   tadalafil  5  MG tablet Commonly known as: CIALIS  Take 1 tablet (5 mg total) by mouth daily as needed for erectile dysfunction.   tamsulosin  0.4 MG Caps capsule Commonly known as: FLOMAX  Take 0.4 mg by mouth daily after supper.   TYLENOL  PO Take 500 mg by mouth as needed.        Review of Systems  Constitutional:  Negative for appetite change, chills, fatigue, fever and unexpected weight change.  HENT:  Negative for congestion, dental problem, ear discharge, ear pain, facial swelling, hearing loss, nosebleeds, postnasal drip, rhinorrhea, sinus pressure, sinus pain, sneezing, sore throat, tinnitus and trouble swallowing.   Eyes:  Positive for visual disturbance. Negative for pain and discharge.       Blurry vision,itchy and redness of eyes at some times  Respiratory:  Negative for cough, chest tightness, shortness of breath and wheezing.   Cardiovascular:  Negative for chest pain, palpitations and leg swelling.  Gastrointestinal:  Negative for abdominal distention, abdominal pain, blood in stool, constipation, diarrhea, nausea and vomiting.  Endocrine: Negative for cold intolerance, heat intolerance, polydipsia, polyphagia and polyuria.  Genitourinary:  Negative for difficulty urinating, dysuria, flank pain and urgency.       Voids several times at night though drinks fluids till late in the evening   Musculoskeletal:  Positive for arthralgias. Negative for back pain, gait problem, joint swelling, myalgias, neck pain and neck stiffness.       Shoulder,arms and feet pain   Skin:  Negative for color change, pallor, rash and wound.  Neurological:  Negative for dizziness, syncope, speech difficulty, weakness, light-headedness, numbness and headaches.  Hematological:  Does not bruise/bleed easily.  Psychiatric/Behavioral:  Negative for agitation, behavioral problems, confusion, hallucinations, self-injury, sleep disturbance and suicidal ideas. The patient is not nervous/anxious.     Immunization  History  Administered Date(s) Administered   Moderna Sars-Covid-2 Vaccination 10/23/2019, 11/20/2019   Td 09/11/2013   Tdap 09/11/2013, 01/14/2024   Pertinent  Health Maintenance Due  Topic Date Due   INFLUENZA VACCINE  08/18/2024 (Originally 12/20/2023)   Colonoscopy  07/22/2032      04/23/2023   11:32 AM 08/15/2023    9:58 AM 08/16/2023   11:09 AM 08/16/2023   11:24 AM 01/14/2024    9:42 AM  Fall Risk  Falls in the past year? 0 0 0  0  Was there an injury with Fall?  0 0  0  Fall Risk Category Calculator  0 0  0  Patient at Risk for Falls Due to No Fall Risks No Fall Risks No Fall Risks No Fall Risks No Fall Risks  Fall risk Follow up Falls evaluation completed Falls evaluation completed Falls evaluation completed Falls evaluation completed Falls evaluation completed   Functional Status Survey:    Vitals:   01/14/24 0947  BP: 116/74  Pulse: 61  Resp: 19  Temp: 97.6 F (36.4 C)  SpO2: 97%  Weight: 136 lb 9.6 oz (62 kg)  Height: 5' (1.524 m)   Body mass index is 26.68 kg/m. Physical Exam  VITALS: T- 97.6, P- 61, BP- 116/74, SaO2- 97% MEASUREMENTS: Weight- 136.6. GENERAL: Alert, cooperative, well developed, no acute distress HEENT: Normocephalic, normal oropharynx, moist mucous membranes, ears normal bilaterally, nose normal, vision grossly intact, no sinus tenderness NECK: Neck non-tender, no cervical adenopathy CHEST: Clear to auscultation bilaterally, no wheezes, rhonchi, or crackles CARDIOVASCULAR: Normal heart rate and rhythm, S1 and S2 normal without murmurs ABDOMEN: Soft, non-tender, non-distended, without organomegaly, normal bowel sounds EXTREMITIES: No cyanosis or edema MUSCULOSKELETAL: Normal range of motion in knees, tightness in left thigh NEUROLOGICAL: Cranial nerves grossly intact, moves all extremities without gross motor or sensory deficit  SKIN: No rash,no lesion or erythema   PSYCHIATRY/BEHAVIORAL: Mood stable    Labs reviewed: Recent Labs     04/24/23 1014 01/14/24 1025  NA 136 138  K 4.7 4.3  CL 105 108  CO2 27 25  GLUCOSE 91 94  BUN 20 19  CREATININE 0.90 0.84  CALCIUM 10.6* 10.5*   Recent Labs    04/24/23 1014 01/14/24 1025  AST 12 16  ALT 13 20  BILITOT 0.4 0.4  PROT 7.0 6.5   Recent Labs    04/24/23 1014 01/14/24 1025  WBC 5.4 4.9  NEUTROABS 2,700 2,127  HGB 15.9 14.5  HCT 46.7 44.4  MCV 87.3 88.4  PLT 296 280   No results found for: TSH No results found for: HGBA1C Lab Results  Component Value Date   CHOL 130 01/14/2024   HDL 34 (L) 01/14/2024   LDLCALC 79 01/14/2024   TRIG 83 01/14/2024   CHOLHDL 3.8 01/14/2024    Significant Diagnostic Results in last 30 days:  No results found.  Assessment/Plan  Visual disturbance with intermittent pruritus and redness of eyes Reports intermittent blurry vision, itching, and redness of eyes. No recent eye examination. No associated runny nose or specific triggers identified for itching. - Refer to an ophthalmologist for further evaluation of visual disturbances.  Hypertension Blood pressure is well-controlled at 116/74 mmHg. No symptoms of headache or dizziness. Weight increased from 131 to 136.6 pounds since last visit. - Encourage weight management and regular monitoring of blood pressure at home.  Gastroesophageal reflux disease without esophagitis Occasional acid reflux, especially when walking uphill. Uses aspirin  from [place] for relief. Discussed risk of gastric irritation and potential bleeding with aspirin  use. - Advise to take aspirin  with food to prevent gastric irritation. - Monitor for any signs of gastrointestinal bleeding.  Nocturia Reports nocturia, urinating four times a night. Possibly related to late-night fluid intake. - Advise to limit fluid intake in the evening, especially after 6 PM. - Encourage increased water intake during the day.  Intermittent musculoskeletal pain (shoulder, arm, thigh, knee, feet) Intermittent pain  in shoulder, arm, thigh, knee, and feet. Arm pain relieved by anti-inflammatory medication from [place]. Foot pain possibly related to inadequate footwear support. Knee pain associated with long walks. - Recommend exercises to strengthen muscles around the knee. - Advise on proper footwear with adequate support. - Continue use of anti-inflammatory medication as needed for pain.  Alcohol use Consumes two beers every other day. Discussed potential health impacts of alcohol, including effects on blood pressure, weight, and memory. Advised on risks of alcohol consumption with aging, including reduced kidney clearance and increased risk of weight gain and diabetes. - Encourage reduction in alcohol consumption to improve overall health. - Discuss potential benefits of reducing alcohol intake on blood pressure and  weight.  General Health Maintenance Discussed vaccinations and dietary recommendations. Encouraged a balanced diet with lean proteins and nuts, and reducing high-fat meats. Discussed importance of pneumonia and flu vaccinations. - Schedule pneumonia and flu vaccinations for next week. - Encourage a balanced diet with lean proteins, nuts, and beans. - Advise on reducing intake of high-fat meats.   Family/ staff Communication: Reviewed plan of care with patient  Labs/tests ordered:  - CBC with Differential/Platelet - CMP with eGFR([facility]) - TSH - Lipid panel  Next Appointment : Return in about 6 months (around 07/16/2024) for medical mangement of chronic issues.SABRA   Spent 30 minutes of Face to face and non-face to face with patient  >50% time spent counseling; reviewing medical record; tests; labs; documentation and developing future plan of care.   Roxan JAYSON Plough, NP

## 2024-01-22 ENCOUNTER — Ambulatory Visit: Payer: Self-pay | Admitting: Family

## 2024-01-22 ENCOUNTER — Telehealth: Payer: Self-pay | Admitting: Family

## 2024-01-22 DIAGNOSIS — R748 Abnormal levels of other serum enzymes: Secondary | ICD-10-CM

## 2024-01-22 NOTE — Telephone Encounter (Signed)
 Carla please advise .SABRA...  Copied from CRM [id number]. Topic: Clinical - Medical Advice >> Jan 22, 2024  4:02 PM Merlynn A wrote: Reason for CRM: [facility] called to advise they do not accept medicaid for referral received for patient. Please refer patient elsewhere. Please note that [facility] does not accept medicaid at all.

## 2024-01-23 NOTE — Telephone Encounter (Addendum)
My Chart message sent to patient/daughter

## 2024-01-23 NOTE — Telephone Encounter (Signed)
My Chart message sent to patient/daughter

## 2024-01-29 ENCOUNTER — Other Ambulatory Visit: Payer: Self-pay | Admitting: Family

## 2024-01-29 ENCOUNTER — Telehealth: Payer: Self-pay

## 2024-01-29 ENCOUNTER — Other Ambulatory Visit

## 2024-01-29 DIAGNOSIS — L03011 Cellulitis of right finger: Secondary | ICD-10-CM

## 2024-01-29 DIAGNOSIS — N529 Male erectile dysfunction, unspecified: Secondary | ICD-10-CM

## 2024-01-29 MED ORDER — DOXYCYCLINE HYCLATE 100 MG PO TABS: 100.0000 mg | ORAL_TABLET | Freq: Two times a day (BID) | ORAL | 0 refills | Status: AC

## 2024-01-29 MED ORDER — TADALAFIL 5 MG PO TABS: 5.0000 mg | ORAL_TABLET | Freq: Every day | ORAL | 3 refills | Status: AC | PRN

## 2024-01-29 NOTE — Progress Notes (Signed)
 Patient was here to get lab work done met with PCP and requested refill for Tadalafil .Also complained of redness and swelling of index finger.Requested antibiotics.Doxycycline  prescription sent to pharmacy.

## 2024-01-29 NOTE — Telephone Encounter (Signed)
 Incoming fax received from patients pharmacy to initiate a prior authorization for                   .  PA initiated through covermymeds. Key: [id number]   Awaiting reply from the insurance company which will be determined in 48-72 hours.

## 2024-01-30 LAB — PTH, INTACT (ICMA) AND IONIZED CALCIUM
Calcium, Ion: 6.3 mg/dL — ABNORMAL HIGH (ref 4.7–5.5)
Calcium: 11.1 mg/dL — ABNORMAL HIGH (ref 8.6–10.3)
PTH: 97 pg/mL — ABNORMAL HIGH (ref 8.6–77)

## 2024-01-30 LAB — PHOSPHORUS: Phosphorus: 2.6 mg/dL (ref 2.1–4.3)

## 2024-01-30 LAB — VITAMIN D 25 HYDROXY (VIT D DEFICIENCY, FRACTURES): Vit D, 25-Hydroxy: 43 ng/mL (ref 30–100)

## 2024-01-30 NOTE — Telephone Encounter (Signed)
 Medication was denied

## 2024-01-30 NOTE — Telephone Encounter (Signed)
 Noted

## 2024-02-14 ENCOUNTER — Ambulatory Visit (INDEPENDENT_AMBULATORY_CARE_PROVIDER_SITE_OTHER): Admitting: Urology

## 2024-02-14 ENCOUNTER — Telehealth: Payer: Self-pay | Admitting: *Deleted

## 2024-02-14 VITALS — BP 126/77 | HR 62

## 2024-02-14 DIAGNOSIS — R351 Nocturia: Secondary | ICD-10-CM

## 2024-02-14 DIAGNOSIS — N529 Male erectile dysfunction, unspecified: Secondary | ICD-10-CM | POA: Diagnosis not present

## 2024-02-14 LAB — URINALYSIS, ROUTINE W REFLEX MICROSCOPIC
Bilirubin, UA: NEGATIVE
Glucose, UA: NEGATIVE
Ketones, UA: NEGATIVE
Leukocytes,UA: NEGATIVE
Nitrite, UA: NEGATIVE
Protein,UA: NEGATIVE
RBC, UA: NEGATIVE
Specific Gravity, UA: 1.025 (ref 1.005–1.030)
Urobilinogen, Ur: 0.2 mg/dL (ref 0.2–1.0)
pH, UA: 6 (ref 5.0–7.5)

## 2024-02-14 LAB — BLADDER SCAN AMB NON-IMAGING: Scan Result: 0

## 2024-02-14 MED ORDER — ALFUZOSIN HCL ER 10 MG PO TB24: 10.0000 mg | ORAL_TABLET | Freq: Every day | ORAL | 11 refills | Status: AC

## 2024-02-14 MED ORDER — TADALAFIL 20 MG PO TABS: 20.0000 mg | ORAL_TABLET | Freq: Every day | ORAL | 5 refills | Status: AC | PRN

## 2024-02-14 NOTE — Telephone Encounter (Signed)
 Copied from CRM #[id number]. Topic: Referral - Question >> Feb 14, 2024 11:45 AM Miquel SAILOR wrote: Reason for CRM: Ambulatory referral to Endocrinology (Order # [id number]) on 02/06/2024-Patient daughter Asberry calling on update due to location does not have the referral. I show AUTH. Needs call back on update [phone]   Forwarded to Referral Coordinator.

## 2024-02-14 NOTE — Progress Notes (Signed)
 02/14/2024 10:49 AM   Michael Underwood 25-Apr-1956 [id number]  Referring provider: Leonarda Roxan BROCKS, NP [address] [place],  [place] [address]  Followup nocturia   HPI: Michael Underwood is a 68yo here for followup for nocturia and erectile dysfunction. He ran out of uroxatral  10mg  and started back on flomax . He does better on uroxatral . IPSS 17 QOL 3. Urine stream. He has intermittent straining to urinate. Nocturia 1-4x depedning on fluid consumption.  He uses tadalafil  5mg  daily with mixed results   PMH: No past medical history on file.  Surgical History: Past Surgical History:  Procedure Laterality Date   HERNIA REPAIR      Home Medications:  Allergies as of 02/14/2024   No Known Allergies      Medication List        Accurate as of February 14, 2024 10:49 AM. If you have any questions, ask your nurse or doctor.          alfuzosin  10 MG 24 hr tablet Commonly known as: UROXATRAL  Take 1 tablet (10 mg total) by mouth at bedtime.   aspirin  EC 81 MG tablet Take 1 tablet (81 mg total) by mouth daily. Swallow whole.   predniSONE  20 MG tablet Commonly known as: DELTASONE  Take 40 mg by mouth daily.   tadalafil  5 MG tablet Commonly known as: CIALIS  Take 1 tablet (5 mg total) by mouth daily as needed for erectile dysfunction.   tamsulosin  0.4 MG Caps capsule Commonly known as: FLOMAX  Take 0.4 mg by mouth daily after supper.   TYLENOL  PO Take 500 mg by mouth as needed.        Allergies: No Known Allergies  Family History: Family History  Problem Relation Age of Onset   Colon cancer Neg Hx    Colon polyps Neg Hx    Esophageal cancer Neg Hx    Rectal cancer Neg Hx    Stomach cancer Neg Hx     Social History:  reports that he has quit smoking. His smoking use included cigarettes. He has never used smokeless tobacco. He reports current alcohol use of about 2.0 standard drinks of alcohol per week. He reports that he does not use drugs.  ROS: All other review of  systems were reviewed and are negative except what is noted above in HPI  Physical Exam: BP 126/77   Pulse 62   Constitutional:  Alert and oriented, No acute distress. HEENT: [place] AT, moist mucus membranes.  Trachea midline, no masses. Cardiovascular: No clubbing, cyanosis, or edema. Respiratory: Normal respiratory effort, no increased work of breathing. GI: Abdomen is soft, nontender, nondistended, no abdominal masses GU: No CVA tenderness.  Lymph: No cervical or inguinal lymphadenopathy. Skin: No rashes, bruises or suspicious lesions. Neurologic: Grossly intact, no focal deficits, moving all 4 extremities. Psychiatric: Normal mood and affect.  Laboratory Data: Lab Results  Component Value Date   WBC 4.9 01/14/2024   HGB 14.5 01/14/2024   HCT 44.4 01/14/2024   MCV 88.4 01/14/2024   PLT 280 01/14/2024    Lab Results  Component Value Date   CREATININE 0.84 01/14/2024    No results found for: PSA  No results found for: TESTOSTERONE  No results found for: HGBA1C  Urinalysis    Component Value Date/Time   COLORURINE YELLOW 03/30/2018 2250   APPEARANCEUR Clear 08/14/2023 1458   LABSPEC 1.020 03/30/2018 2250   PHURINE 6.0 03/30/2018 2250   GLUCOSEU Negative 08/14/2023 1458   HGBUR LARGE (A) 03/30/2018 2250  BILIRUBINUR negative 09/18/2023 0945   BILIRUBINUR Negative 08/14/2023 1458   KETONESUR negative 09/18/2023 0945   KETONESUR 20 (A) 03/30/2018 2250   PROTEINUR negative 09/18/2023 0945   PROTEINUR Negative 08/14/2023 1458   PROTEINUR 100 (A) 03/30/2018 2250   UROBILINOGEN 0.2 09/18/2023 0945   NITRITE Negative 09/18/2023 0945   NITRITE Negative 08/14/2023 1458   NITRITE NEGATIVE 03/30/2018 2250   LEUKOCYTESUR Negative 09/18/2023 0945   LEUKOCYTESUR Negative 08/14/2023 1458    Lab Results  Component Value Date   LABMICR Comment 08/14/2023   BACTERIA NONE SEEN 03/30/2018    Pertinent Imaging:  No results found for this or any previous visit.  No  results found for this or any previous visit.  No results found for this or any previous visit.  No results found for this or any previous visit.  No results found for this or any previous visit.  No results found for this or any previous visit.  No results found for this or any previous visit.  No results found for this or any previous visit.   Assessment & Plan:    1. Erectile dysfunction, unspecified erectile dysfunction type (Primary) We will trial tadalafil  20mg  rpn  2. Nocturia Restart uroxatral  10mg  qhs - Urinalysis, Routine w reflex microscopic - BLADDER SCAN AMB NON-IMAGING   No follow-ups on file.  Belvie Clara, MD  [facility] [place]

## 2024-02-14 NOTE — Progress Notes (Signed)
 Bladder Scan completed today.  Patient can void prior to the bladder scan. Bladder scan result: 0  Performed By: [facility] LPN

## 2024-02-18 ENCOUNTER — Encounter: Payer: Self-pay | Admitting: Urology

## 2024-02-18 NOTE — Patient Instructions (Signed)

## 2024-02-19 ENCOUNTER — Telehealth: Payer: Self-pay | Admitting: Family

## 2024-02-19 NOTE — Telephone Encounter (Signed)
 Message routed to referral coordinator Carla.B

## 2024-02-19 NOTE — Telephone Encounter (Signed)
 Please advise...  Copied from CRM #[id number]. Topic: Referral - Question >> Feb 19, 2024  9:47 AM Susanna ORN wrote: Patient's daughter, Asberry, called in stating that [facility] is stating that they have not received a referral. There is a referral that was placed on 02/06/24. Asberry states they told her that they do not have her dad in their system. She's trying to get him scheduled. Please give Asberry a call back with an update on this referral. CB #: [phone].

## 2024-02-19 NOTE — Telephone Encounter (Signed)
 Referral was sent on 9/18 /2025 to the following on Mychart too:  Referring To Provider Information [facility] [address], [address] [place] [address] [phone]   Referral Start Date: 02/06/2024 Referral End Date: 02/05/2025   Will have our referral Coordinator to follow up then update patient.

## 2024-02-24 NOTE — Telephone Encounter (Signed)
 Patient is schedule 03/02/24 at [facility].

## 2024-02-24 NOTE — Telephone Encounter (Signed)
 noted

## 2024-03-02 DIAGNOSIS — E210 Primary hyperparathyroidism: Secondary | ICD-10-CM | POA: Diagnosis not present

## 2024-03-27 ENCOUNTER — Ambulatory Visit: Admitting: Urology

## 2024-07-16 ENCOUNTER — Ambulatory Visit: Payer: Self-pay | Admitting: Family

## 2024-08-21 ENCOUNTER — Ambulatory Visit: Admitting: Urology
